# Patient Record
Sex: Female | Born: 1965 | Race: White | Hispanic: No | Marital: Married | Smoking: Never smoker
Health system: Southern US, Community
[De-identification: ages and names within clinical notes are randomized; demographics above are authoritative.]

## PROBLEM LIST (undated history)

## (undated) DIAGNOSIS — I509 Heart failure, unspecified: Secondary | ICD-10-CM

## (undated) DIAGNOSIS — E119 Type 2 diabetes mellitus without complications: Secondary | ICD-10-CM

## (undated) DIAGNOSIS — R7301 Impaired fasting glucose: Secondary | ICD-10-CM

## (undated) DIAGNOSIS — I219 Acute myocardial infarction, unspecified: Secondary | ICD-10-CM

## (undated) DIAGNOSIS — E669 Obesity, unspecified: Secondary | ICD-10-CM

## (undated) DIAGNOSIS — E785 Hyperlipidemia, unspecified: Secondary | ICD-10-CM

## (undated) DIAGNOSIS — I2510 Atherosclerotic heart disease of native coronary artery without angina pectoris: Secondary | ICD-10-CM

## (undated) DIAGNOSIS — K219 Gastro-esophageal reflux disease without esophagitis: Secondary | ICD-10-CM

## (undated) DIAGNOSIS — I10 Essential (primary) hypertension: Secondary | ICD-10-CM

## (undated) HISTORY — DX: Impaired fasting glucose: R73.01

## (undated) HISTORY — DX: Heart failure, unspecified: I50.9

## (undated) HISTORY — DX: Acute myocardial infarction, unspecified: I21.9

## (undated) HISTORY — DX: Atherosclerotic heart disease of native coronary artery without angina pectoris: I25.10

## (undated) HISTORY — DX: Obesity, unspecified: E66.9

## (undated) HISTORY — DX: Type 2 diabetes mellitus without complications: E11.9

## (undated) HISTORY — DX: Essential (primary) hypertension: I10

## (undated) HISTORY — DX: Gastro-esophageal reflux disease without esophagitis: K21.9

## (undated) HISTORY — DX: Hyperlipidemia, unspecified: E78.5

---

## 1997-12-21 ENCOUNTER — Other Ambulatory Visit: Admission: RE | Admit: 1997-12-21 | Discharge: 1997-12-21 | Payer: Self-pay | Admitting: Gynecology

## 1999-03-24 ENCOUNTER — Other Ambulatory Visit: Admission: RE | Admit: 1999-03-24 | Discharge: 1999-03-24 | Payer: Self-pay | Admitting: Gynecology

## 2000-06-11 ENCOUNTER — Other Ambulatory Visit: Admission: RE | Admit: 2000-06-11 | Discharge: 2000-06-11 | Payer: Self-pay | Admitting: Gynecology

## 2001-07-17 ENCOUNTER — Other Ambulatory Visit: Admission: RE | Admit: 2001-07-17 | Discharge: 2001-07-17 | Payer: Self-pay | Admitting: Gynecology

## 2002-08-12 ENCOUNTER — Other Ambulatory Visit: Admission: RE | Admit: 2002-08-12 | Discharge: 2002-08-12 | Payer: Self-pay | Admitting: Gynecology

## 2003-09-29 ENCOUNTER — Other Ambulatory Visit: Admission: RE | Admit: 2003-09-29 | Discharge: 2003-09-29 | Payer: Self-pay | Admitting: Gynecology

## 2004-08-12 ENCOUNTER — Emergency Department (HOSPITAL_COMMUNITY): Admission: EM | Admit: 2004-08-12 | Discharge: 2004-08-12 | Payer: Self-pay | Admitting: Emergency Medicine

## 2004-12-27 ENCOUNTER — Other Ambulatory Visit: Admission: RE | Admit: 2004-12-27 | Discharge: 2004-12-27 | Payer: Self-pay | Admitting: Gynecology

## 2006-01-04 ENCOUNTER — Other Ambulatory Visit: Admission: RE | Admit: 2006-01-04 | Discharge: 2006-01-04 | Payer: Self-pay | Admitting: Gynecology

## 2007-01-08 ENCOUNTER — Other Ambulatory Visit: Admission: RE | Admit: 2007-01-08 | Discharge: 2007-01-08 | Payer: Self-pay | Admitting: Gynecology

## 2007-01-30 ENCOUNTER — Encounter: Admission: RE | Admit: 2007-01-30 | Discharge: 2007-01-30 | Payer: Self-pay | Admitting: Gynecology

## 2008-01-21 ENCOUNTER — Other Ambulatory Visit: Admission: RE | Admit: 2008-01-21 | Discharge: 2008-01-21 | Payer: Self-pay | Admitting: Gynecology

## 2008-02-03 ENCOUNTER — Encounter: Admission: RE | Admit: 2008-02-03 | Discharge: 2008-02-03 | Payer: Self-pay | Admitting: Gynecology

## 2009-02-11 ENCOUNTER — Encounter: Admission: RE | Admit: 2009-02-11 | Discharge: 2009-02-11 | Payer: Self-pay | Admitting: Gynecology

## 2011-01-24 ENCOUNTER — Ambulatory Visit (HOSPITAL_COMMUNITY)
Admission: RE | Admit: 2011-01-24 | Discharge: 2011-01-24 | Disposition: A | Discharge status: Home / Self Care | Payer: BC Managed Care – PPO | Source: Ambulatory Visit | Attending: Family Medicine | Admitting: Family Medicine

## 2011-01-24 ENCOUNTER — Other Ambulatory Visit (HOSPITAL_COMMUNITY): Payer: Self-pay | Admitting: Family Medicine

## 2011-01-24 DIAGNOSIS — J069 Acute upper respiratory infection, unspecified: Secondary | ICD-10-CM

## 2011-01-24 DIAGNOSIS — R05 Cough: Secondary | ICD-10-CM | POA: Insufficient documentation

## 2011-01-24 DIAGNOSIS — R059 Cough, unspecified: Secondary | ICD-10-CM | POA: Insufficient documentation

## 2012-07-23 ENCOUNTER — Other Ambulatory Visit (HOSPITAL_COMMUNITY): Payer: Self-pay | Admitting: Cardiovascular Disease

## 2012-07-23 DIAGNOSIS — R011 Cardiac murmur, unspecified: Secondary | ICD-10-CM

## 2012-07-30 ENCOUNTER — Ambulatory Visit (HOSPITAL_COMMUNITY)
Admission: RE | Admit: 2012-07-30 | Discharge: 2012-07-30 | Disposition: A | Discharge status: Home / Self Care | Payer: BC Managed Care – PPO | Source: Ambulatory Visit | Attending: Cardiovascular Disease | Admitting: Cardiovascular Disease

## 2012-07-30 DIAGNOSIS — E785 Hyperlipidemia, unspecified: Secondary | ICD-10-CM | POA: Insufficient documentation

## 2012-07-30 DIAGNOSIS — I079 Rheumatic tricuspid valve disease, unspecified: Secondary | ICD-10-CM | POA: Insufficient documentation

## 2012-07-30 DIAGNOSIS — K219 Gastro-esophageal reflux disease without esophagitis: Secondary | ICD-10-CM | POA: Insufficient documentation

## 2012-07-30 DIAGNOSIS — R011 Cardiac murmur, unspecified: Secondary | ICD-10-CM | POA: Insufficient documentation

## 2012-07-30 DIAGNOSIS — Z8249 Family history of ischemic heart disease and other diseases of the circulatory system: Secondary | ICD-10-CM | POA: Insufficient documentation

## 2012-07-30 DIAGNOSIS — E669 Obesity, unspecified: Secondary | ICD-10-CM | POA: Insufficient documentation

## 2012-07-30 DIAGNOSIS — I517 Cardiomegaly: Secondary | ICD-10-CM | POA: Insufficient documentation

## 2012-07-30 NOTE — Progress Notes (Signed)
[facility]   2D echo completed 07/30/2012.   Cindy Elnore Cosens, RDCS  

## 2012-08-19 ENCOUNTER — Encounter (INDEPENDENT_AMBULATORY_CARE_PROVIDER_SITE_OTHER): Payer: BC Managed Care – PPO

## 2012-08-19 DIAGNOSIS — R0602 Shortness of breath: Secondary | ICD-10-CM

## 2012-08-22 ENCOUNTER — Other Ambulatory Visit: Payer: Self-pay | Admitting: Cardiovascular Disease

## 2012-08-22 LAB — LIPID PANEL
Cholesterol: 186 mg/dL (ref 0–200)
HDL: 35 mg/dL — ABNORMAL LOW (ref >39)
LDL Cholesterol: 119 mg/dL — ABNORMAL HIGH (ref 0–99)
Total CHOL/HDL Ratio: 5.3 Ratio
VLDL: 32 mg/dL (ref 0–40)

## 2012-08-28 ENCOUNTER — Encounter: Payer: Self-pay | Admitting: Cardiovascular Disease

## 2013-07-28 ENCOUNTER — Other Ambulatory Visit: Payer: Self-pay | Admitting: Gynecology

## 2013-07-28 DIAGNOSIS — R928 Other abnormal and inconclusive findings on diagnostic imaging of breast: Secondary | ICD-10-CM

## 2013-08-07 ENCOUNTER — Ambulatory Visit
Admission: RE | Admit: 2013-08-07 | Discharge: 2013-08-07 | Disposition: A | Discharge status: Home / Self Care | Payer: BC Managed Care – PPO | Source: Ambulatory Visit | Attending: Gynecology | Admitting: Gynecology

## 2013-08-07 DIAGNOSIS — R928 Other abnormal and inconclusive findings on diagnostic imaging of breast: Secondary | ICD-10-CM

## 2013-12-11 ENCOUNTER — Telehealth: Payer: Self-pay | Admitting: Neurology

## 2013-12-11 NOTE — Telephone Encounter (Signed)
I CALLED PATIENT TO SEE IF SHE WANTED TO COME IN ON Monday 12-15-13 PER DR PATEL HAD TO LEAVE HER A MESSAGE

## 2013-12-19 ENCOUNTER — Encounter: Payer: Self-pay | Admitting: *Deleted

## 2013-12-19 NOTE — Progress Notes (Unsigned)
Patient cancelled new patient appointment for 01-20-14. Dr. Sherwood GamblerFusco office notified

## 2013-12-22 ENCOUNTER — Other Ambulatory Visit: Payer: Self-pay | Admitting: Gynecology

## 2013-12-22 DIAGNOSIS — N6320 Unspecified lump in the left breast, unspecified quadrant: Secondary | ICD-10-CM

## 2014-01-20 ENCOUNTER — Ambulatory Visit: Payer: BC Managed Care – PPO | Admitting: Neurology

## 2014-01-27 ENCOUNTER — Encounter (INDEPENDENT_AMBULATORY_CARE_PROVIDER_SITE_OTHER): Payer: Self-pay

## 2014-01-27 ENCOUNTER — Ambulatory Visit
Admission: RE | Admit: 2014-01-27 | Discharge: 2014-01-27 | Disposition: A | Discharge status: Home / Self Care | Payer: BC Managed Care – PPO | Source: Ambulatory Visit | Attending: Gynecology | Admitting: Gynecology

## 2014-01-27 DIAGNOSIS — N6320 Unspecified lump in the left breast, unspecified quadrant: Secondary | ICD-10-CM

## 2014-07-06 ENCOUNTER — Other Ambulatory Visit: Payer: Self-pay

## 2014-07-06 DIAGNOSIS — Z1231 Encounter for screening mammogram for malignant neoplasm of breast: Secondary | ICD-10-CM

## 2014-07-07 ENCOUNTER — Encounter (INDEPENDENT_AMBULATORY_CARE_PROVIDER_SITE_OTHER): Payer: Self-pay | Admitting: *Deleted

## 2014-07-21 ENCOUNTER — Telehealth (INDEPENDENT_AMBULATORY_CARE_PROVIDER_SITE_OTHER): Payer: Self-pay | Admitting: *Deleted

## 2014-07-21 ENCOUNTER — Other Ambulatory Visit (INDEPENDENT_AMBULATORY_CARE_PROVIDER_SITE_OTHER): Payer: Self-pay | Admitting: *Deleted

## 2014-07-21 ENCOUNTER — Encounter (INDEPENDENT_AMBULATORY_CARE_PROVIDER_SITE_OTHER): Payer: Self-pay | Admitting: Internal Medicine

## 2014-07-21 ENCOUNTER — Ambulatory Visit (INDEPENDENT_AMBULATORY_CARE_PROVIDER_SITE_OTHER): Payer: BLUE CROSS/BLUE SHIELD | Admitting: Internal Medicine

## 2014-07-21 VITALS — BP 98/62 | HR 66 | Temp 97.9°F | Ht 67.0 in | Wt 225.8 lb

## 2014-07-21 DIAGNOSIS — K625 Hemorrhage of anus and rectum: Secondary | ICD-10-CM

## 2014-07-21 MED ORDER — PEG 3350-KCL-NA BICARB-NACL 420 G PO SOLR: 4000.0000 mL | Freq: Once | ORAL | Status: DC

## 2014-07-21 NOTE — Telephone Encounter (Signed)
Patient needs trilyte 

## 2014-07-21 NOTE — Progress Notes (Signed)
   Subjective:    Patient ID: Olivia Knight, female    DOB: Jul 10, 1965, 49 y.o.   MRN: [id number]  HPI Referred to our office by Dr. Phillips OdorGolding for rectal bleeding. She tell me she had rectal bleeding x 1 day. Occurred about 2 months ago. She saw Dr. Chevis PrettyMezer ([facility]) same day and was noted to have blood around her rectum.  Every time she went to the [facility] she would see blood. She has not seen any blood since. No abdominal pain. She was not constipated. She usually has a stool x 1 a day.  Appetite is good. No weight loss unintentional.  Stools are brown in color. No change in her stools.  No hx of anemia.  No family hx colon cancer. Review of Systems No past medical history on file.  No past surgical history on file.  Allergies  Allergen Reactions  . Fish Allergy   . Penicillins     No current outpatient prescriptions on file prior to visit.   No current facility-administered medications on file prior to visit.   Married, two step children. Works at the [employer]     Objective:   Physical Exam Blood pressure 98/62, pulse 66, temperature 97.9 F (36.6 C), height 5\' 7"  (1.702 m), weight 225 lb 12.8 oz (102.422 kg). Alert and oriented. Skin warm and dry. Oral mucosa is moist.   . Sclera anicteric, conjunctivae is pink. Thyroid not enlarged. No cervical lymphadenopathy. Lungs clear. Heart regular rate and rhythm.  Abdomen is soft. Bowel sounds are positive. No hepatomegaly. No abdominal masses felt. No tenderness.  No edema to lower extremities.         Assessment & Plan:  Rectal bleeding. Colonic neoplasm, AVM, polyp, hemorrhoid needs to be ruled out. No family hx of colon cancer. Colonoscopy. The risks and benefits such as perforation, bleeding, and infection were reviewed with the patient and is agreeable.

## 2014-07-21 NOTE — Patient Instructions (Signed)
Colonoscopy.  The risks and benefits such as perforation, bleeding, and infection were reviewed with the patient and is agreeable. 

## 2014-07-24 ENCOUNTER — Encounter (HOSPITAL_COMMUNITY)
Admission: RE | Disposition: A | Discharge status: Home / Self Care | Payer: Self-pay | Source: Ambulatory Visit | Attending: Internal Medicine

## 2014-07-24 ENCOUNTER — Ambulatory Visit (HOSPITAL_COMMUNITY)
Admission: RE | Admit: 2014-07-24 | Discharge: 2014-07-24 | Disposition: A | Discharge status: Home / Self Care | Payer: BLUE CROSS/BLUE SHIELD | Source: Ambulatory Visit | Attending: Internal Medicine | Admitting: Internal Medicine

## 2014-07-24 DIAGNOSIS — K644 Residual hemorrhoidal skin tags: Secondary | ICD-10-CM | POA: Insufficient documentation

## 2014-07-24 DIAGNOSIS — Z91013 Allergy to seafood: Secondary | ICD-10-CM | POA: Insufficient documentation

## 2014-07-24 DIAGNOSIS — Z881 Allergy status to other antibiotic agents status: Secondary | ICD-10-CM | POA: Diagnosis not present

## 2014-07-24 DIAGNOSIS — K6289 Other specified diseases of anus and rectum: Secondary | ICD-10-CM | POA: Diagnosis not present

## 2014-07-24 DIAGNOSIS — K648 Other hemorrhoids: Secondary | ICD-10-CM | POA: Diagnosis not present

## 2014-07-24 DIAGNOSIS — K625 Hemorrhage of anus and rectum: Secondary | ICD-10-CM

## 2014-07-24 DIAGNOSIS — Z880 Allergy status to penicillin: Secondary | ICD-10-CM | POA: Diagnosis not present

## 2014-07-24 HISTORY — PX: COLONOSCOPY: SHX5424

## 2014-07-24 SURGERY — COLONOSCOPY
Anesthesia: Moderate Sedation

## 2014-07-24 MED ORDER — MIDAZOLAM HCL 5 MG/5ML IJ SOLN
INTRAMUSCULAR | Status: DC | PRN
  Administered 2014-07-24 (×6): 2 mg via INTRAVENOUS

## 2014-07-24 MED ORDER — MIDAZOLAM HCL 5 MG/5ML IJ SOLN
INTRAMUSCULAR | Status: AC
  Filled 2014-07-24: qty 5

## 2014-07-24 MED ORDER — SODIUM CHLORIDE 0.9 % IV SOLN
INTRAVENOUS | Status: DC
  Administered 2014-07-24: 1000 mL via INTRAVENOUS

## 2014-07-24 MED ORDER — MEPERIDINE HCL 50 MG/ML IJ SOLN
INTRAMUSCULAR | Status: DC | PRN
  Administered 2014-07-24 (×2): 25 mg via INTRAVENOUS

## 2014-07-24 MED ORDER — MEPERIDINE HCL 50 MG/ML IJ SOLN
INTRAMUSCULAR | Status: AC
  Filled 2014-07-24: qty 1

## 2014-07-24 MED ORDER — STERILE WATER FOR IRRIGATION IR SOLN
Status: DC | PRN
  Administered 2014-07-24: 14:00:00

## 2014-07-24 MED ORDER — MIDAZOLAM HCL 5 MG/5ML IJ SOLN
INTRAMUSCULAR | Status: AC
  Filled 2014-07-24: qty 10

## 2014-07-24 NOTE — Discharge Instructions (Signed)
Resume usual diet and medications. No driving for 24 hours. Notify if you have further episodes of rectal bleeding.   Colonoscopy, Care After These instructions give you information on caring for yourself after your procedure. Your doctor may also give you more specific instructions. Call your doctor if you have any problems or questions after your procedure. HOME CARE  Do not drive for 24 hours.  Do not sign important papers or use machinery for 24 hours.  You may shower.  You may go back to your usual activities, but go slower for the first 24 hours.  Take rest breaks often during the first 24 hours.  Walk around or use warm packs on your belly (abdomen) if you have belly cramping or gas.  Drink enough fluids to keep your pee (urine) clear or pale yellow.  Resume your normal diet. Avoid heavy or fried foods.  Avoid drinking alcohol for 24 hours or as told by your doctor.  Only take medicines as told by your doctor. If a tissue sample (biopsy) was taken during the procedure:   Do not take aspirin or blood thinners for 7 days, or as told by your doctor.  Do not drink alcohol for 7 days, or as told by your doctor.  Eat soft foods for the first 24 hours. GET HELP IF: You still have a small amount of blood in your poop (stool) 2-3 days after the procedure. GET HELP RIGHT AWAY IF:  You have more than a small amount of blood in your poop.  You see clumps of tissue (blood clots) in your poop.  Your belly is puffy (swollen).  You feel sick to your stomach (nauseous) or throw up (vomit).  You have a fever.  You have belly pain that gets worse and medicine does not help. MAKE SURE YOU:  Understand these instructions.  Will watch your condition.  Will get help right away if you are not doing well or get worse. Document Released: 04/01/2010 Document Revised: 03/04/2013 Document Reviewed: 11/04/2012 [facility] Patient Information 2015 [employer] [place]. This information is  not intended to replace advice given to you by your health care provider. Make sure you discuss any questions you have with your health care provider.

## 2014-07-24 NOTE — Op Note (Signed)
COLONOSCOPY PROCEDURE REPORT  PATIENT:  Olivia Knight  MR#:  [id number] Birthdate:  01/31/1966, 49 y.o., female Endoscopist:  Dr. Malissa HippoNajeeb U. Nyheem Binette, MD Referred By:  Dr. Colette RibasJohn Cabot Golding, MD  Procedure Date: 07/24/2014  Procedure:   Colonoscopy  Indications:  Patient is 49 year old Caucasian female who had 4 episodes of rectal bleeding 1 day for a month ago. Family history is negative for CRC.  Informed Consent:  The procedure and risks were reviewed with the patient and informed consent was obtained.  Medications:  Demerol 50 mg IV Versed 12 mg IV  Description of procedure:  After a digital rectal exam was performed, that colonoscope was advanced from the anus through the rectum and colon to the area of the cecum, ileocecal valve and appendiceal orifice. The cecum was deeply intubated. These structures were well-seen and photographed for the record. From the level of the cecum and ileocecal valve, the scope was slowly and cautiously withdrawn. The mucosal surfaces were carefully surveyed utilizing scope tip to flexion to facilitate fold flattening as needed. The scope was pulled down into the rectum where a thorough exam including retroflexion was performed.  Findings:   Prep excellent. Normal mucosa of cecum, ascending colon, hepatic flexure, transverse colon, splenic flexure, descending and sigmoid colon. Normal rectal mucosa. Small hemorrhoids below the dentate line and single small anal papilla.   Therapeutic/Diagnostic Maneuvers Performed:  None  Complications:  None  Cecal Withdrawal Time:  10 minutes  Impression:  Examination performed to cecum. Small external hemorrhoids and single anal papilla otherwise normal colonoscopy.  Comment; She possibly from hemorrhoids or could've experienced self-limiting bout of colitis. No further workup unless bleeding recurs.  Recommendations:  Standard instructions given. Patient advised to call if she has further episodes of  rectal bleeding.  Annina Piotrowski U  07/24/2014 2:47 PM  CC: Dr. Phillips OdorGOLDING, Chancy HurterJOHN CABOT, MD & Dr. Bonnetta BarryNo ref. provider found

## 2014-07-24 NOTE — H&P (Signed)
Olivia Knight is an 49 y.o. female.   Chief Complaint: Patient is here for colonoscopy. HPI: Patient is 49 year old Caucasian female who had four episodes of rectal bleeding 1 day over a month ago. She did not experience abdominal pain diarrhea nausea vomiting. Each time she past few cc of fresh blood. She has not seen any more blood. Family history is negative for CRC.  No past medical history on file.  No past surgical history on file.  No family history on file. Social History:  reports that she has never smoked. She does not have any smokeless tobacco history on file. She reports that she does not drink alcohol or use illicit drugs.  Allergies:  Allergies  Allergen Reactions  . Ciprofloxacin   . Fish Allergy   . Penicillins   . Shellfish Allergy Itching    Medications Prior to Admission  Medication Sig Dispense Refill  . Fish Oil-Cholecalciferol (FISH OIL + D3) 1000-1000 MG-UNIT CAPS Take by mouth.    . norethindrone (MICRONOR,CAMILA,ERRIN) 0.35 MG tablet Take 1 tablet by mouth daily.    . polyethylene glycol-electrolytes (NULYTELY/GOLYTELY) 420 G solution Take 4,000 mLs by mouth once. 4000 mL 0    No results found for this or any previous visit (from the past 48 hour(s)). No results found.  ROS  Blood pressure 127/66, pulse 73, temperature 97.9 F (36.6 C), temperature source Oral, resp. rate 13, SpO2 100 %. Physical Exam  Constitutional: She appears well-developed and well-nourished.  HENT:  Mouth/Throat: Oropharynx is clear and moist.  Eyes: Conjunctivae are normal. No scleral icterus.  Neck: No thyromegaly present.  Cardiovascular: Normal rate, regular rhythm and normal heart sounds.   No murmur heard. Respiratory: Effort normal and breath sounds normal.  GI: Soft. She exhibits no distension and no mass. There is no tenderness.  Musculoskeletal: She exhibits no edema.  Lymphadenopathy:    She has no cervical adenopathy.  Neurological: She is alert.  Skin:  Skin is warm and dry.     Assessment/Plan Rectal bleeding. Diagnostic colonoscopy.  Kenric Ginger U 07/24/2014, 1:55 PM

## 2014-07-27 ENCOUNTER — Encounter (HOSPITAL_COMMUNITY): Payer: Self-pay | Admitting: Internal Medicine

## 2014-07-29 ENCOUNTER — Ambulatory Visit (INDEPENDENT_AMBULATORY_CARE_PROVIDER_SITE_OTHER): Payer: Self-pay | Admitting: Internal Medicine

## 2014-08-11 ENCOUNTER — Other Ambulatory Visit: Payer: Self-pay | Admitting: Gynecology

## 2014-08-11 ENCOUNTER — Other Ambulatory Visit: Payer: Self-pay

## 2014-08-11 DIAGNOSIS — N6320 Unspecified lump in the left breast, unspecified quadrant: Secondary | ICD-10-CM

## 2014-08-14 ENCOUNTER — Ambulatory Visit
Admission: RE | Admit: 2014-08-14 | Discharge: 2014-08-14 | Disposition: A | Discharge status: Home / Self Care | Payer: BLUE CROSS/BLUE SHIELD | Source: Ambulatory Visit | Attending: Gynecology | Admitting: Gynecology

## 2014-08-14 ENCOUNTER — Ambulatory Visit: Payer: Self-pay

## 2014-08-14 DIAGNOSIS — N6320 Unspecified lump in the left breast, unspecified quadrant: Secondary | ICD-10-CM

## 2014-11-04 ENCOUNTER — Encounter: Payer: Self-pay | Admitting: Cardiovascular Disease

## 2014-11-11 ENCOUNTER — Encounter: Payer: Self-pay | Admitting: Cardiovascular Disease

## 2015-05-07 IMAGING — MG MM DIAGNOSTIC UNILATERAL L
2 series · 2 of 2 positions shown · non-contrast
Comparison: Priors

CLINICAL DATA: Patient recalled from screening for possible left
breast mass.

EXAM:
DIGITAL DIAGNOSTIC  LEFT MAMMOGRAM
ULTRASOUND LEFT BREAST

[L CC]
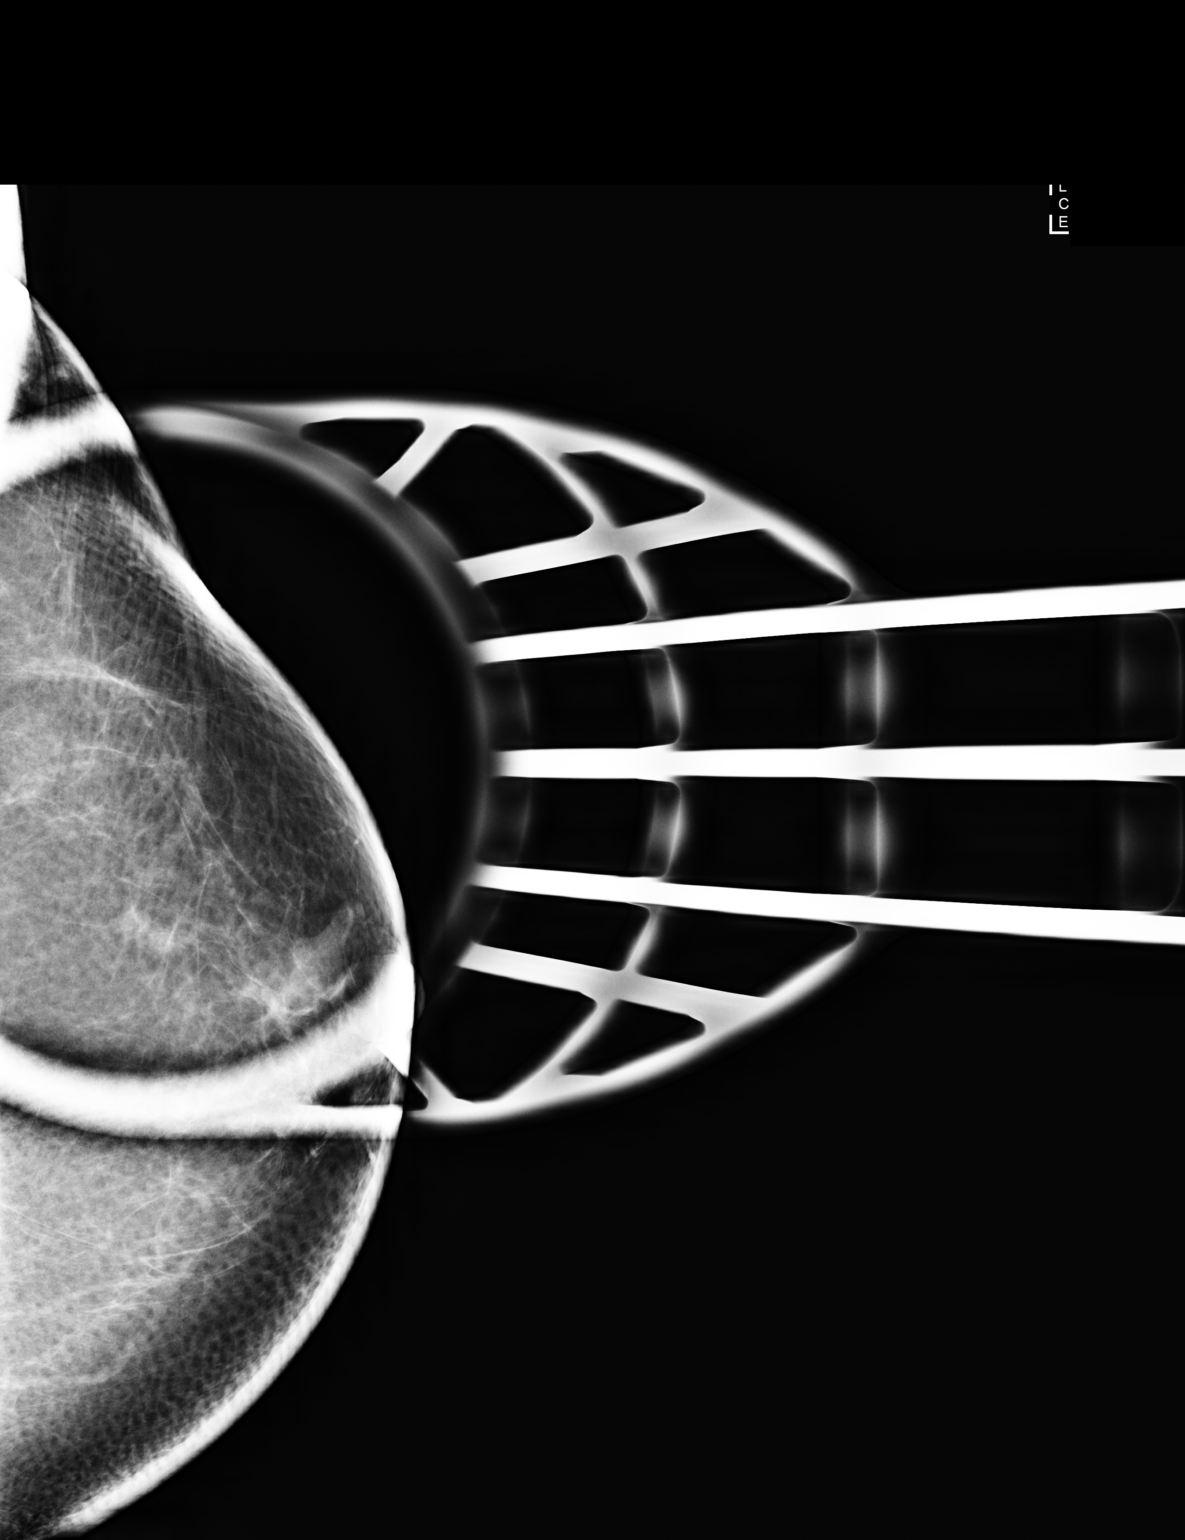

[L MLO]
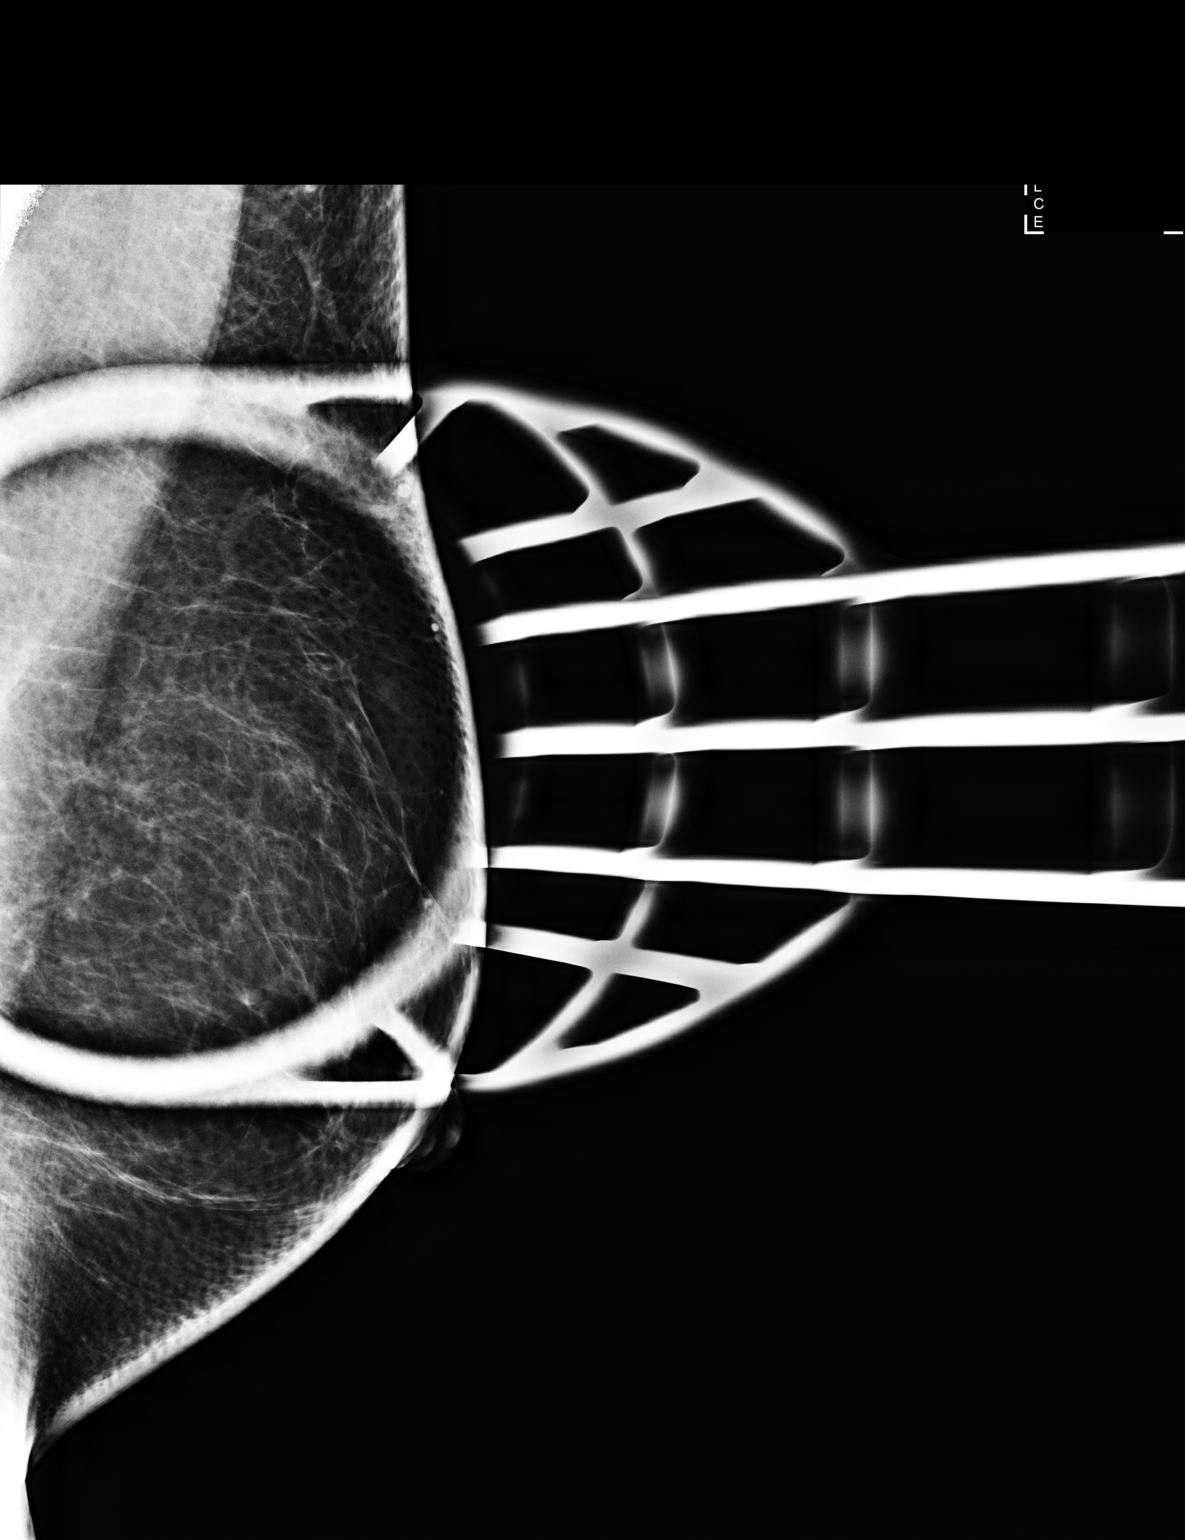

[2 of 2 positions shown; findings below may reference images not displayed]

ACR Breast Density Category b: There are scattered areas of
fibroglandular density.
FINDINGS: Spot compression CC and MLO views of the left breast were obtained.
These demonstrate a low density circumscribed 4 mm nodule within the
upper-outer left breast middle depth.

On physical exam, I palpate no discrete mass within the upper-outer
left breast.

Ultrasound is performed, showing no concerning mass within the
upper-outer left breast middle depth.
IMPRESSION: Probably benign low-density nodule upper outer left breast.

RECOMMENDATION:
Left breast diagnostic mammography in 6 months to demonstrate
stability.

I have discussed the findings and recommendations with the patient.
Results were also provided in writing at the conclusion of the
visit. If applicable, a reminder letter will be sent to the patient
regarding the next appointment.

BI-RADS CATEGORY  3: Probably benign.

## 2015-08-06 ENCOUNTER — Other Ambulatory Visit: Payer: Self-pay

## 2015-08-06 DIAGNOSIS — Z1231 Encounter for screening mammogram for malignant neoplasm of breast: Secondary | ICD-10-CM

## 2015-08-27 ENCOUNTER — Ambulatory Visit
Admission: RE | Admit: 2015-08-27 | Discharge: 2015-08-27 | Disposition: A | Discharge status: Home / Self Care | Payer: BLUE CROSS/BLUE SHIELD | Source: Ambulatory Visit

## 2015-08-27 DIAGNOSIS — Z1231 Encounter for screening mammogram for malignant neoplasm of breast: Secondary | ICD-10-CM

## 2017-05-26 IMAGING — MG DIGITAL SCREENING BILATERAL MAMMOGRAM WITH CAD
4 series · 4 of 4 positions shown · non-contrast
Comparison: Previous exam(s).

CLINICAL DATA: Screening.

EXAM:
DIGITAL SCREENING BILATERAL MAMMOGRAM WITH CAD

[R MLO]
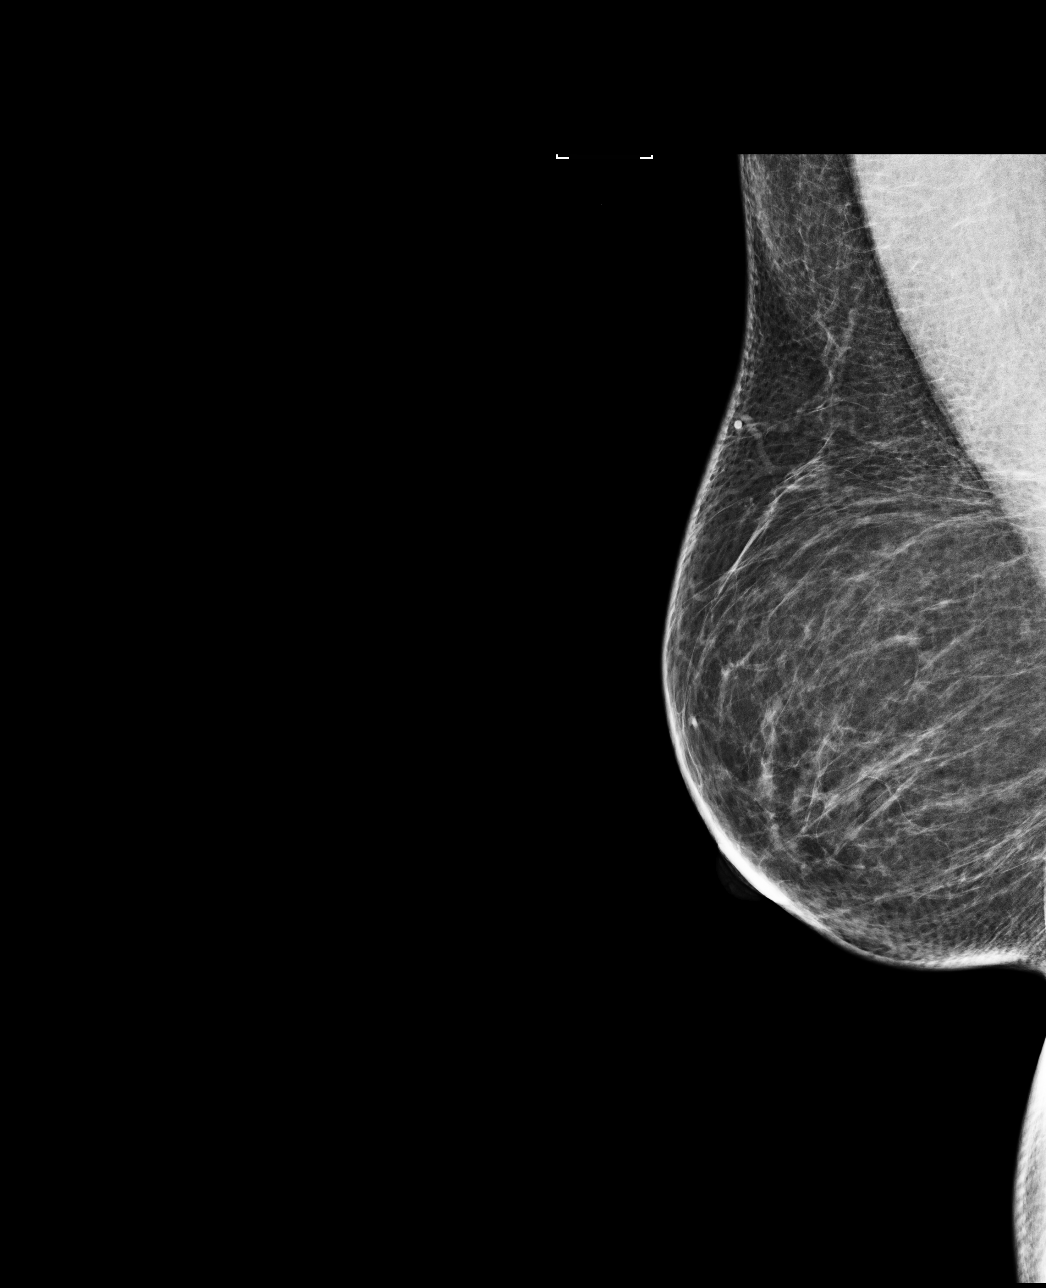

[R CC]
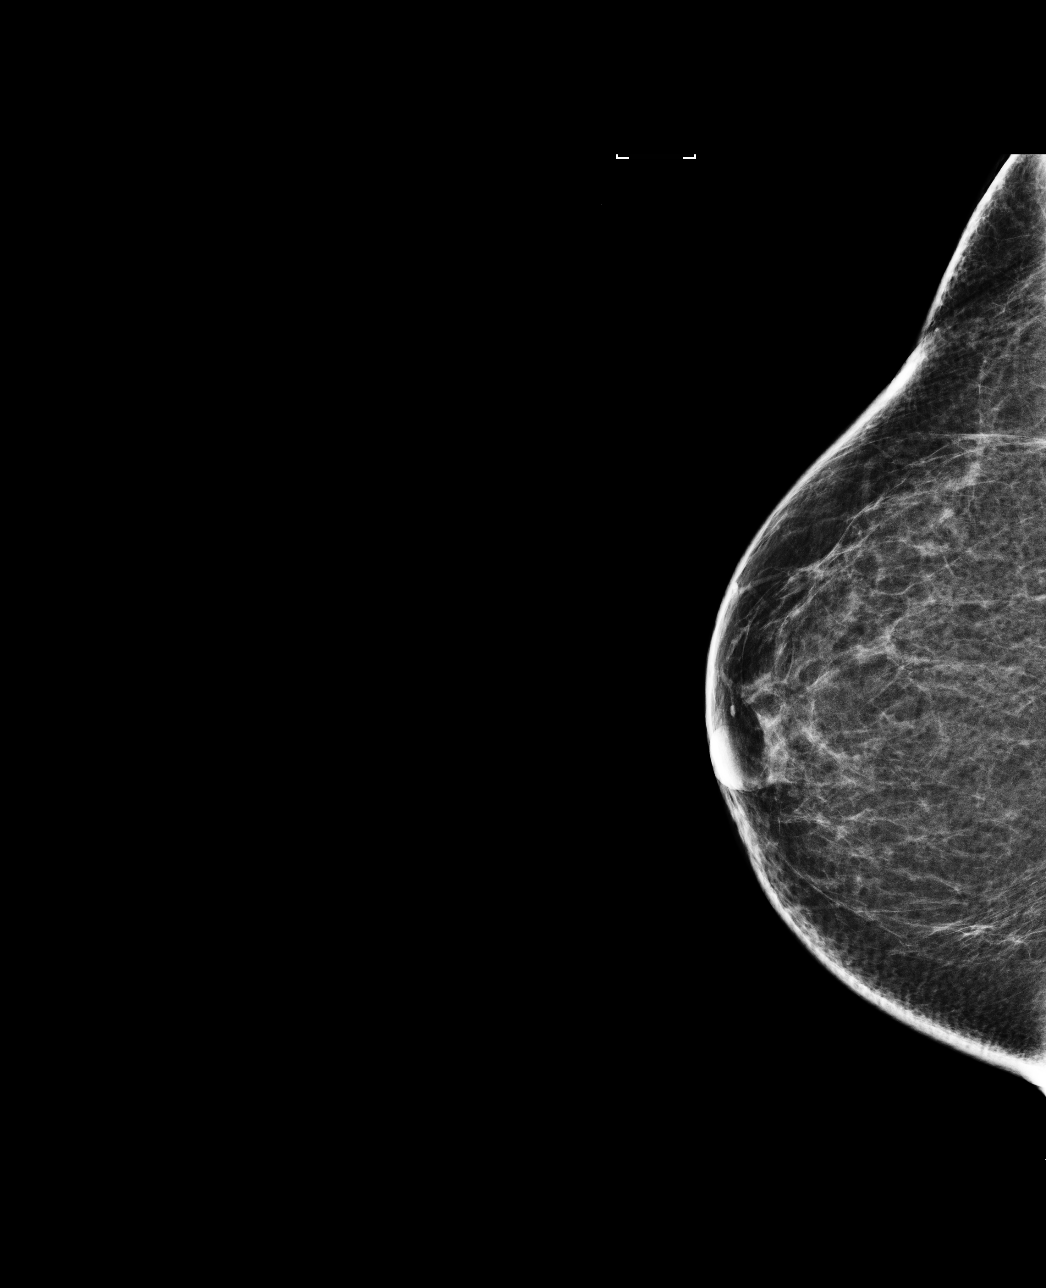

[L CC]
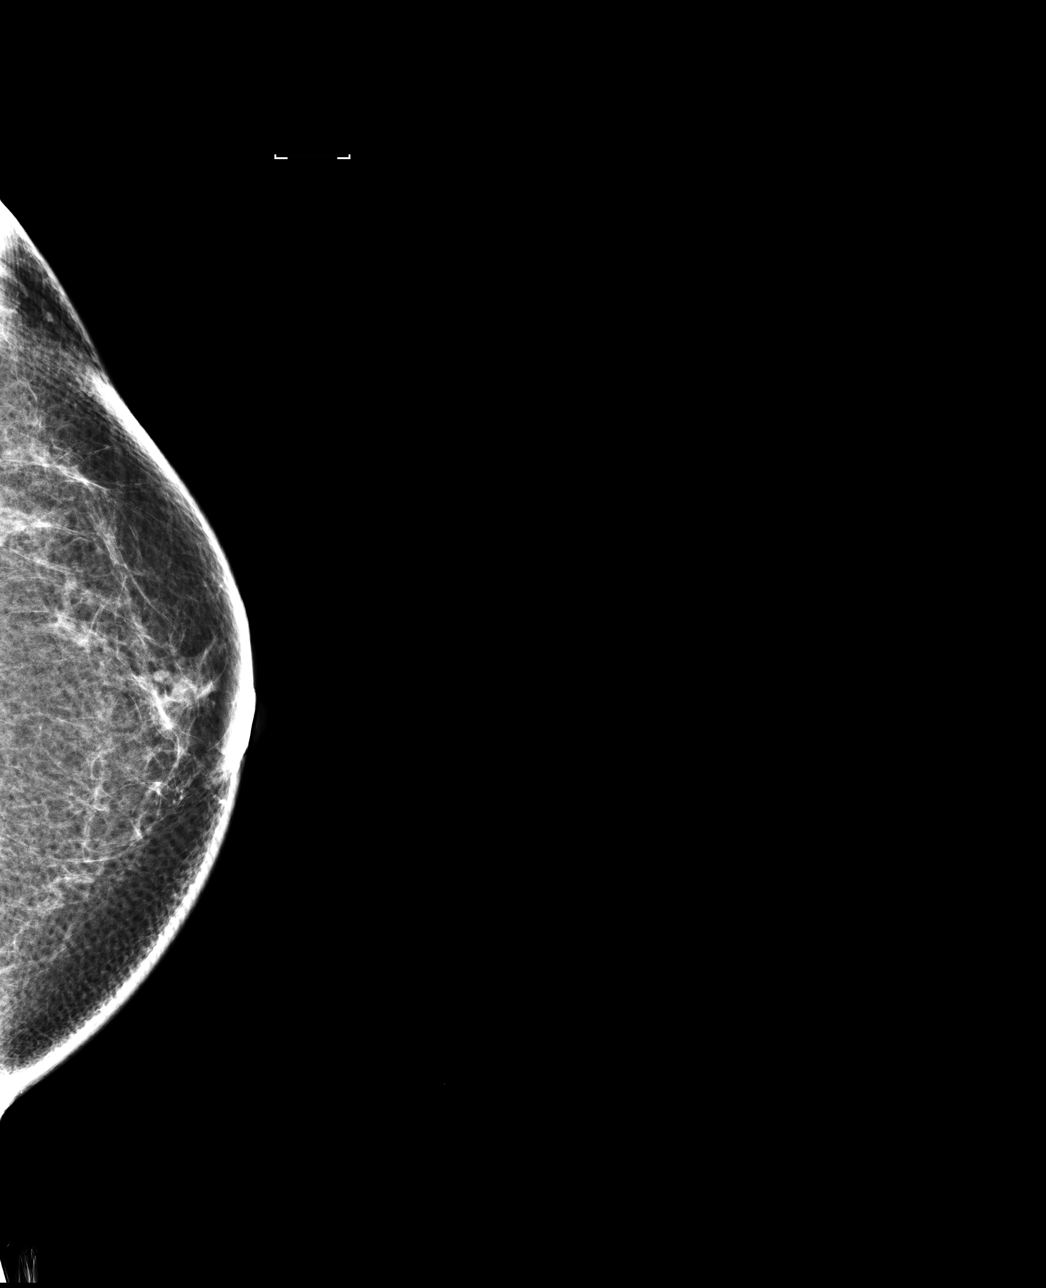

[L MLO]
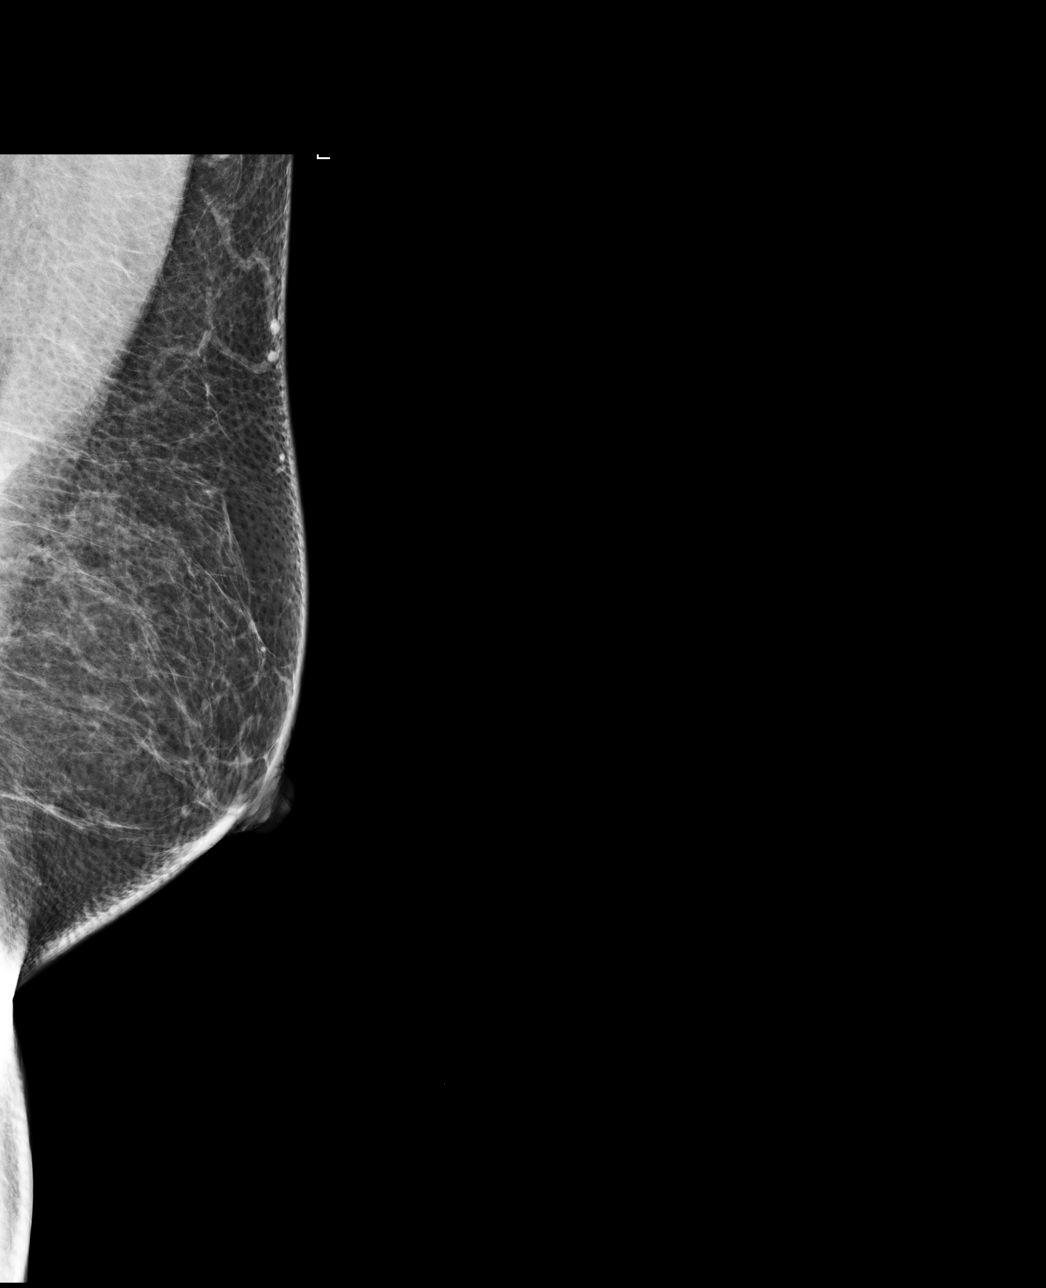

[4 of 4 positions shown; findings below may reference images not displayed]

ACR Breast Density Category b: There are scattered areas of
fibroglandular density.
FINDINGS: There are no findings suspicious for malignancy. Images were
processed with CAD.
IMPRESSION: No mammographic evidence of malignancy. A result letter of this
screening mammogram will be mailed directly to the patient.

RECOMMENDATION:
Screening mammogram in one year. (Code:AS-G-LCT)

BI-RADS CATEGORY  1: Negative.

## 2021-10-12 DIAGNOSIS — M7502 Adhesive capsulitis of left shoulder: Secondary | ICD-10-CM | POA: Diagnosis not present

## 2021-10-13 ENCOUNTER — Ambulatory Visit (INDEPENDENT_AMBULATORY_CARE_PROVIDER_SITE_OTHER): Payer: Commercial Managed Care - PPO | Admitting: Orthopedic Surgery

## 2021-10-13 ENCOUNTER — Ambulatory Visit (INDEPENDENT_AMBULATORY_CARE_PROVIDER_SITE_OTHER): Payer: Commercial Managed Care - PPO

## 2021-10-13 ENCOUNTER — Encounter: Payer: Self-pay | Admitting: Orthopedic Surgery

## 2021-10-13 ENCOUNTER — Ambulatory Visit: Payer: Self-pay

## 2021-10-13 DIAGNOSIS — M7502 Adhesive capsulitis of left shoulder: Secondary | ICD-10-CM | POA: Diagnosis not present

## 2021-10-13 DIAGNOSIS — M25512 Pain in left shoulder: Secondary | ICD-10-CM

## 2021-10-15 ENCOUNTER — Encounter: Payer: Self-pay | Admitting: Orthopedic Surgery

## 2021-10-15 MED ORDER — METHYLPREDNISOLONE ACETATE 40 MG/ML IJ SUSP
40.0000 mg | INTRAMUSCULAR | Status: AC | PRN
  Administered 2021-10-13: 40 mg via INTRA_ARTICULAR

## 2021-10-15 MED ORDER — LIDOCAINE HCL 1 % IJ SOLN
5.0000 mL | INTRAMUSCULAR | Status: AC | PRN
  Administered 2021-10-13: 5 mL

## 2021-10-15 MED ORDER — BUPIVACAINE HCL 0.5 % IJ SOLN
9.0000 mL | INTRAMUSCULAR | Status: AC | PRN
  Administered 2021-10-13: 9 mL via INTRA_ARTICULAR

## 2021-10-15 NOTE — Progress Notes (Addendum)
Office Visit Note   Patient: Olivia Knight           Date of Birth: April 28, 1965           MRN: [id number] Visit Date: 10/13/2021 Requested by: Assunta Found, MD [address] [place],  [place] [address] PCP: Assunta Found, MD  Subjective: Chief Complaint  Patient presents with   Left Shoulder - Pain    HPI: Olivia Knight is a 56 year old patient with 49-month history of left shoulder pain.  Denies any history of injury.  Reports decreased range of motion and pain.  Denies any numbness and tingling or any neck pain.  Has tried ibuprofen without much relief.              ROS: All systems reviewed are negative as they relate to the chief complaint within the history of present illness.  Patient denies  fevers or chills.   Assessment & Plan: Visit Diagnoses:  1. Left shoulder pain, unspecified chronicity   2. Adhesive capsulitis of left shoulder     Plan: Impression is left shoulder adhesive capsulitis.  Plan is home exercise program plus glenohumeral joint injection today.  I can see her back when she is coming in to follow-up with her husband.  Assessment can be made at that time in approximately 6 weeks for repeat injection and formal physical therapy versus observation versus surgical manipulation under anesthesia with rotator interval release.  Follow-Up Instructions: Return if symptoms worsen or fail to improve.   Orders:  Orders Placed This Encounter  Procedures   XR Shoulder Left   US Guided Needle Placement - No Linked Charges   No orders of the defined types were placed in this encounter.     Procedures: Large Joint Inj: L glenohumeral on 10/13/2021 9:40 AM Indications: diagnostic evaluation and pain Details: 18 G 1.5 in needle, ultrasound-guided posterior approach  Arthrogram: No  Medications: 9 mL bupivacaine 0.5 %; 40 mg methylPREDNISolone acetate 40 MG/ML; 5 mL lidocaine 1 % Outcome: tolerated well, no immediate complications Procedure, treatment alternatives,  risks and benefits explained, specific risks discussed. Consent was given by the patient. Immediately prior to procedure a time out was called to verify the correct patient, procedure, equipment, support staff and site/side marked as required. Patient was prepped and draped in the usual sterile fashion.       Clinical Data: No additional findings.  Objective: Vital Signs: There were no vitals taken for this visit.  Physical Exam:   Constitutional: Patient appears well-developed HEENT:  Head: Normocephalic Eyes:EOM are normal Neck: Normal range of motion Cardiovascular: Normal rate Pulmonary/chest: Effort normal Neurologic: Patient is alert Skin: Skin is warm Psychiatric: Patient has normal mood and affect   Ortho Exam: Ortho exam demonstrates full active and passive range of motion of the cervical spine.  Left shoulder demonstrates passive range of motion of approximately 30/80/120.  This is diminished compared to the right-hand side.  Rotator cuff strength intact.  Motor or sensory function to the hand intact  Specialty Comments:  No specialty comments available.  Imaging: No results found.   PMFS History: Patient Active Problem List   Diagnosis Date Noted   Rectal bleeding 07/21/2014   Past Medical History:  Diagnosis Date   CAD (coronary artery disease)    CHF (congestive heart failure) (HCC)    Diabetes (HCC)    GERD (gastroesophageal reflux disease)    Hyperlipidemia    Hypertension    Impaired fasting glucose    MI (myocardial  infarction) (HCC)    Obesity     History reviewed. No pertinent family history.  Past Surgical History:  Procedure Laterality Date   COLONOSCOPY N/A 07/24/2014   Procedure: COLONOSCOPY;  Surgeon: Malissa Hippo, MD;  Location[facility];  Service: Endoscopy;  Laterality: N/A;  120   Social History   Occupational History   Not on file  Tobacco Use   Smoking status: Never   Smokeless tobacco: Not on file  Substance and  Sexual Activity   Alcohol use: No    Alcohol/week: 0.0 standard drinks of alcohol   Drug use: No   Sexual activity: Not on file

## 2021-10-24 ENCOUNTER — Telehealth: Payer: Self-pay | Admitting: Surgical

## 2021-10-24 NOTE — Telephone Encounter (Signed)
Patient returns following glenohumeral injection into the left shoulder for adhesive capsulitis.  She is accompanying her husband to his appointment today following his shoulder surgery.  She reports some relief from injection but not 100% relief.  She has been doing home exercise program over the last week for about 15 minutes/day but states that she could be doing more exercising.  On exam, she has 20 degrees external rotation, 50 degrees abduction, 120 degrees forward flexion of the left shoulder compared with the right shoulder which has 60 degrees external rotation, 85 degrees abduction, 160 degrees forward flexion.  Plan for her to return in 4 weeks for clinical recheck with Oswaldo Done, her husband so that they will be seen together and consider repeat glenohumeral injection at that time.

## 2023-01-10 ENCOUNTER — Ambulatory Visit (INDEPENDENT_AMBULATORY_CARE_PROVIDER_SITE_OTHER): Payer: Commercial Managed Care - PPO | Admitting: Orthopedic Surgery

## 2023-01-10 ENCOUNTER — Other Ambulatory Visit (INDEPENDENT_AMBULATORY_CARE_PROVIDER_SITE_OTHER): Payer: Commercial Managed Care - PPO

## 2023-01-10 ENCOUNTER — Encounter: Payer: Self-pay | Admitting: Orthopedic Surgery

## 2023-01-10 DIAGNOSIS — M25562 Pain in left knee: Secondary | ICD-10-CM

## 2023-01-10 DIAGNOSIS — G8929 Other chronic pain: Secondary | ICD-10-CM

## 2023-01-10 DIAGNOSIS — M1712 Unilateral primary osteoarthritis, left knee: Secondary | ICD-10-CM | POA: Diagnosis not present

## 2023-01-10 DIAGNOSIS — M25551 Pain in right hip: Secondary | ICD-10-CM | POA: Diagnosis not present

## 2023-01-10 MED ORDER — METHYLPREDNISOLONE 4 MG PO TBPK: ORAL_TABLET | ORAL | 0 refills | Status: DC

## 2023-01-14 ENCOUNTER — Encounter: Payer: Self-pay | Admitting: Orthopedic Surgery

## 2023-01-14 MED ORDER — METHYLPREDNISOLONE ACETATE 40 MG/ML IJ SUSP
40.0000 mg | INTRAMUSCULAR | Status: AC | PRN
  Administered 2023-01-10: 40 mg via INTRA_ARTICULAR

## 2023-01-14 MED ORDER — BUPIVACAINE HCL 0.25 % IJ SOLN
4.0000 mL | INTRAMUSCULAR | Status: AC | PRN
  Administered 2023-01-10: 4 mL via INTRA_ARTICULAR

## 2023-01-14 MED ORDER — LIDOCAINE HCL 1 % IJ SOLN
5.0000 mL | INTRAMUSCULAR | Status: AC | PRN
  Administered 2023-01-10: 5 mL

## 2023-01-14 NOTE — Progress Notes (Signed)
Office Visit Note   Patient: Olivia Knight           Date of Birth: May 02, 1965           MRN: [id number] Visit Date: 01/10/2023 Requested by: Assunta Found, MD [address] [place],  [place] [address] PCP: Assunta Found, MD  Subjective: Chief Complaint  Patient presents with   Left Knee - Pain    HPI: Olivia Knight is a 57 y.o. female who presents to the office reporting left knee pain since July.  The patient fell over the dog and she sustained a hyperflexion injury to the knee at that time.  Having some difficulty getting up.  Describes medial pain.  No grinding catching or giving way.  She is okay to walk.  She also reports some superior gluteal pain.  18 years ago she had a cortisone shot in the knee and has had no problems since..                ROS: All systems reviewed are negative as they relate to the chief complaint within the history of present illness.  Patient denies fevers or chills.  Assessment & Plan: Visit Diagnoses:  1. Chronic pain of left knee   2. 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  She also has left knee pain with hyperflexion injury and possible meniscal problem.  Radiographs are fairly unrevealing.  Plan at this time is Medrol Dosepak 6-day course with left knee injection.  We can consider further intervention depending on how she responds to those interventions in terms of injection for the knee and Dosepak for the back.  Follow-Up Instructions: No follow-ups on file.   Orders:  Orders Placed This Encounter  Procedures   XR Lumbar Spine 2-3 Views   XR KNEE 3 VIEW LEFT   Meds ordered this encounter  Medications   methylPREDNISolone (MEDROL DOSEPAK) 4 MG TBPK tablet    Sig: Take as directed on packaging.    Dispense:  21 tablet    Refill:  0      Procedures: Large Joint Inj: L knee on 01/10/2023 11:56 AM Indications: diagnostic evaluation, joint swelling and  pain Details: 18 G 1.5 in needle, superolateral approach  Arthrogram: No  Medications: 5 mL lidocaine 1 %; 40 mg methylPREDNISolone acetate 40 MG/ML; 4 mL bupivacaine 0.25 % Outcome: tolerated well, no immediate complications Procedure, treatment alternatives, risks and benefits explained, specific risks discussed. Consent was given by the patient. Immediately prior to procedure a time out was called to verify the correct patient, procedure, equipment, support staff and site/side marked as required. Patient was prepped and draped in the usual sterile fashion.       Clinical Data: No additional findings.  Objective: Vital Signs: There were no vitals taken for this visit.  Physical Exam:  Constitutional: Patient appears well-developed HEENT:  Head: Normocephalic Eyes:EOM are normal Neck: Normal range of motion Cardiovascular: Normal rate Pulmonary/chest: Effort normal Neurologic: Patient is alert Skin: Skin is warm Psychiatric: Patient has normal mood and affect  Ortho Exam: Ortho exam demonstrates palpable pedal pulses.  No nerve root tension signs.  Left knee has no effusion but medial joint line tenderness.  Collateral cruciate ligaments are stable.  McMurray compression testing equivocal on the left negative on the right.  Range of motion is full in that left knee.  No groin pain with internal/external rotation of either  leg.  No other masses lymphadenopathy or skin changes noted in that knee region.  Specialty Comments:  No specialty comments available.  Imaging: No results found.   PMFS History: Patient Active Problem List   Diagnosis Date Noted   Rectal bleeding 07/21/2014   Past Medical History:  Diagnosis Date   CAD (coronary artery disease)    CHF (congestive heart failure) (HCC)    Diabetes (HCC)    GERD (gastroesophageal reflux disease)    Hyperlipidemia    Hypertension    Impaired fasting glucose    MI (myocardial infarction) (HCC)    Obesity      History reviewed. No pertinent family history.  Past Surgical History:  Procedure Laterality Date   COLONOSCOPY N/A 07/24/2014   Procedure: COLONOSCOPY;  Surgeon: Malissa Hippo, MD;  Location: [facility];  Service: Endoscopy;  Laterality: N/A;  120   Social History   Occupational History   Not on file  Tobacco Use   Smoking status: Never   Smokeless tobacco: Not on file  Substance and Sexual Activity   Alcohol use: No    Alcohol/week: 0.0 standard drinks of alcohol   Drug use: No   Sexual activity: Not on file

## 2023-04-23 ENCOUNTER — Ambulatory Visit (INDEPENDENT_AMBULATORY_CARE_PROVIDER_SITE_OTHER): Payer: Commercial Managed Care - PPO | Admitting: Orthopedic Surgery

## 2023-04-23 ENCOUNTER — Telehealth: Payer: Self-pay

## 2023-04-23 ENCOUNTER — Encounter: Payer: Self-pay | Admitting: Orthopedic Surgery

## 2023-04-23 ENCOUNTER — Other Ambulatory Visit: Payer: Self-pay | Admitting: Surgical

## 2023-04-23 DIAGNOSIS — M5416 Radiculopathy, lumbar region: Secondary | ICD-10-CM

## 2023-04-23 MED ORDER — DIAZEPAM 5 MG PO TABS: 5.0000 mg | ORAL_TABLET | Freq: Once | ORAL | 0 refills | Status: AC

## 2023-04-23 NOTE — Telephone Encounter (Signed)
 Sent in

## 2023-04-23 NOTE — Progress Notes (Signed)
Office Visit Note   Patient: Olivia Knight           Date of Birth: December 07, 1965           MRN: [id number] Visit Date: 04/23/2023 Requested by: Assunta Found, MD [address] [place],  [place] [address] PCP: Assunta Found, MD  Subjective: Chief Complaint  Patient presents with   Left Knee - Pain, Follow-up   Right Hip - Follow-up, Pain    HPI: Olivia Knight is a 58 y.o. female who presents to the office reporting left knee and right hip pain.  Patient had left knee injection on 01/10/2023 and she did get very good relief from that for about 3 months.  She states "I can deal with my knee".  She also reports some right buttock and radiating pain down into the lateral compartment of the right leg.  Hurts more with sitting.  Takes ibuprofen on a daily basis for her symptoms.  Will occasionally wake her from sleep.  Has been going on for well over 6 weeks.  She has to adjust herself when sitting because of the pain.  Describes relatively constant pain in the back and buttock region.  She has tried a prior Medrol Dosepak without any relief..                ROS: All systems reviewed are negative as they relate to the chief complaint within the history of present illness.  Patient denies fevers or chills.  Assessment & Plan: Visit Diagnoses:  1. Radiculopathy, lumbar region     Plan: Impression is left knee which is doing well following injection.  We talked about another injection today but she wants to hold off on that.  She has about a 5 degree flexion contracture and trace effusion but otherwise is managing reasonably well to her left knee and no intervention required at this time.  Regarding her back she is having clear right-sided radiculopathy at L4-5.  She is failed conservative management including a home exercise stretching program as well as activity modification and a Medrol Dosepak.  Symptoms ongoing for well over 6 weeks.  Radiographs of the lumbar spine show mild  degenerative changes.  Plan at this time is MRI open of the lumbar spine to evaluate for epidural steroid injections.  This does not look like a surgical problem.  Her husband has had back surgery and she wants to avoid that if possible.  She can follow-up or I can call her with those results and we will get her set up to see Dr. Alvester Morin in the near future  Follow-Up Instructions: No follow-ups on file.   Orders:  Orders Placed This Encounter  Procedures   MR Lumbar Spine w/o contrast   No orders of the defined types were placed in this encounter.     Procedures: No procedures performed   Clinical Data: No additional findings.  Objective: Vital Signs: There were no vitals taken for this visit.  Physical Exam:  Constitutional: Patient appears well-developed HEENT:  Head: Normocephalic Eyes:EOM are normal Neck: Normal range of motion Cardiovascular: Normal rate Pulmonary/chest: Effort normal Neurologic: Patient is alert Skin: Skin is warm Psychiatric: Patient has normal mood and affect  Ortho Exam: Ortho exam demonstrates no nerve root tension signs.  5 out of 5 ankle dorsiflexion plantarflexion quad hamstring strength.  No definite paresthesias L1 S1 bilaterally.  No groin pain with internal/external Tatian of the leg.  Patient does have some pain forward lateral bending  but no trochanteric tenderness.  Left knee has trace effusion with 5 degree flexion contracture but full flexion.  Collateral cruciate ligaments are stable.  Pedal pulses palpable.  Specialty Comments:  No specialty comments available.  Imaging: No results found.   PMFS History: Patient Active Problem List   Diagnosis Date Noted   Rectal bleeding 07/21/2014   Past Medical History:  Diagnosis Date   CAD (coronary artery disease)    CHF (congestive heart failure) (HCC)    Diabetes (HCC)    GERD (gastroesophageal reflux disease)    Hyperlipidemia    Hypertension    Impaired fasting glucose    MI  (myocardial infarction) (HCC)    Obesity     History reviewed. No pertinent family history.  Past Surgical History:  Procedure Laterality Date   COLONOSCOPY N/A 07/24/2014   Procedure: COLONOSCOPY;  Surgeon: Malissa Hippo, MD;  Location: [facility];  Service: Endoscopy;  Laterality: N/A;  120   Social History   Occupational History   Not on file  Tobacco Use   Smoking status: Never   Smokeless tobacco: Not on file  Substance and Sexual Activity   Alcohol use: No    Alcohol/week: 0.0 standard drinks of alcohol   Drug use: No   Sexual activity: Not on file

## 2023-04-23 NOTE — Telephone Encounter (Signed)
 Patient needing valium  pre MRI procedure

## 2023-04-26 ENCOUNTER — Encounter: Payer: Self-pay | Admitting: Orthopedic Surgery

## 2023-04-30 ENCOUNTER — Ambulatory Visit
Admission: RE | Admit: 2023-04-30 | Discharge: 2023-04-30 | Disposition: A | Discharge status: Home / Self Care | Payer: Commercial Managed Care - PPO | Source: Ambulatory Visit | Attending: Orthopedic Surgery | Admitting: Orthopedic Surgery

## 2023-04-30 DIAGNOSIS — M5416 Radiculopathy, lumbar region: Secondary | ICD-10-CM

## 2023-05-09 ENCOUNTER — Ambulatory Visit (INDEPENDENT_AMBULATORY_CARE_PROVIDER_SITE_OTHER): Payer: Commercial Managed Care - PPO | Admitting: Orthopedic Surgery

## 2023-05-09 ENCOUNTER — Telehealth: Payer: Self-pay

## 2023-05-09 DIAGNOSIS — M5416 Radiculopathy, lumbar region: Secondary | ICD-10-CM

## 2023-05-09 DIAGNOSIS — M1712 Unilateral primary osteoarthritis, left knee: Secondary | ICD-10-CM

## 2023-05-09 NOTE — Telephone Encounter (Signed)
Auth needed for left knee gel 

## 2023-05-10 ENCOUNTER — Encounter: Payer: Self-pay | Admitting: Orthopedic Surgery

## 2023-05-10 NOTE — Progress Notes (Signed)
 Office Visit Note   Patient: Olivia Knight           Date of Birth: 01-24-1966           MRN: [id number] Visit Date: 05/09/2023 Requested by: Assunta Found, MD [address] [place],  [place] [address] PCP: Assunta Found, MD  Subjective: Chief Complaint  Patient presents with   Lower Back - Pain, Follow-up    HPI: Olivia Knight is a 58 y.o. female who presents to the office reporting low back pain radiating down the right leg.  Takes ibuprofen twice a day.  MRI scan is reviewed.  Shows moderate right-sided L5 1 and L3-4 foraminal stenosis.  Patient denies any weakness.  Does not take any anticoagulants.  She also is describing continued left knee pain.  Has had an injection in the left knee last year which gave her good relief.  She is not really interested in knee replacement but does have some arthritis in the left knee..                ROS: All systems reviewed are negative as they relate to the chief complaint within the history of present illness.  Patient denies fevers or chills.  Assessment & Plan: Visit Diagnoses:  1. Radiculopathy, lumbar region     Plan: Impression is right lower extremity radiculopathy with left knee arthritis.  MRI scan shows pathology consistent with her symptoms.  Does not look operative at this time.  Recommend epidural steroid injection with Dr. Alvester Morin and we will preapproved for gel injection on the left and also inject that left knee today. This patient is diagnosed with osteoarthritis of the knee(s).    Radiographs show evidence of joint space narrowing, osteophytes, subchondral sclerosis and/or subchondral cysts.  This patient has knee pain which interferes with functional and activities of daily living.    This patient has experienced inadequate response, adverse effects and/or intolerance with conservative treatments such as acetaminophen, NSAIDS, topical creams, physical therapy or regular exercise, knee bracing and/or weight loss.    This patient has experienced inadequate response or has a contraindication to intra articular steroid injections for at least 3 months.   This patient is not scheduled to have a total knee replacement within 6 months of starting treatment with viscosupplementation.   Follow-Up Instructions: No follow-ups on file.   Orders:  Orders Placed This Encounter  Procedures   Ambulatory referral to Physical Medicine Rehab   No orders of the defined types were placed in this encounter.     Procedures: Large Joint Inj: L knee on 05/09/2023 1:12 PM Indications: diagnostic evaluation, joint swelling and pain Details: 18 G 1.5 in needle, superolateral approach  Arthrogram: No  Medications: 5 mL lidocaine 1 %; 40 mg methylPREDNISolone acetate 40 MG/ML; 4 mL bupivacaine 0.25 % Outcome: tolerated well, no immediate complications Procedure, treatment alternatives, risks and benefits explained, specific risks discussed. Consent was given by the patient. Immediately prior to procedure a time out was called to verify the correct patient, procedure, equipment, support staff and site/side marked as required. Patient was prepped and draped in the usual sterile fashion.       Clinical Data: No additional findings.  Objective: Vital Signs: There were no vitals taken for this visit.  Physical Exam:  Constitutional: Patient appears well-developed HEENT:  Head: Normocephalic Eyes:EOM are normal Neck: Normal range of motion Cardiovascular: Normal rate Pulmonary/chest: Effort normal Neurologic: Patient is alert Skin: Skin is warm Psychiatric: Patient has normal  mood and affect  Ortho Exam: Ortho exam demonstrates range of motion of the left knee of 10-1 10.  Mild nerve root tension signs on the right compared to the left.  No groin pain with internal/external Tatian of either leg.  No effusion in either knee.  Pedal pulses palpable.  Patient has 5 out of 5 ankle dorsiflexion plantarflexion quad  and hamstring strength with no definite paresthesias L 1 to S1 bilaterally.  Specialty Comments:  Narrative & Impression CLINICAL DATA:  Right-sided radicular pain   EXAM: MRI LUMBAR SPINE WITHOUT CONTRAST   TECHNIQUE: Multiplanar, multisequence MR imaging of the lumbar spine was performed. No intravenous contrast was administered.   COMPARISON:  X-ray 01/10/2023   FINDINGS: Segmentation:  Standard.   Alignment:  Mild dextrocurvature.  No significant listhesis.   Vertebrae:  No fracture, evidence of discitis, or bone lesion.   Conus medullaris and cauda equina: Conus extends to the T12-L1 level. Conus and cauda equina appear normal.   Paraspinal and other soft tissues: Negative.   Disc levels:   T12-L1: Broad-based right paracentral disc protrusion with mild impress upon the ventral thecal sac although no significant canal stenosis. No foraminal stenosis.   L1-L2: Unremarkable.   L2-L3: No disc protrusion. Mild bilateral facet arthropathy. No significant foraminal or canal stenosis.   L3-L4: Annular disc bulge and mild bilateral facet arthropathy. No significant canal stenosis. Borderline-mild right foraminal stenosis.   L4-L5: Annular disc bulge with mild bilateral facet arthropathy. No canal stenosis. Borderline-mild bilateral foraminal stenosis.   L5-S1: Mild annular disc bulge and mild bilateral facet hypertrophy. No canal stenosis. Mild-moderate right foraminal stenosis.   IMPRESSION: 1. Mild multilevel lumbar spondylosis. No significant canal stenosis at any level. 2. 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 Borderline-mild foraminal stenosis on the right at L3-L4 and bilaterally at L4-L5.     Electronically Signed   By: Duanne Guess D.O.   On: 05/09/2023 12:57  Imaging: No results found.   PMFS History: Patient Active Problem List   Diagnosis Date Noted   Rectal bleeding 07/21/2014   Past Medical History:  Diagnosis Date   CAD  (coronary artery disease)    CHF (congestive heart failure) (HCC)    Diabetes (HCC)    GERD (gastroesophageal reflux disease)    Hyperlipidemia    Hypertension    Impaired fasting glucose    MI (myocardial infarction) (HCC)    Obesity     History reviewed. No pertinent family history.  Past Surgical History:  Procedure Laterality Date   COLONOSCOPY N/A 07/24/2014   Procedure: COLONOSCOPY;  Surgeon: Malissa Hippo, MD;  Location: [facility];  Service: Endoscopy;  Laterality: N/A;  120   Social History   Occupational History   Not on file  Tobacco Use   Smoking status: Never   Smokeless tobacco: Not on file  Substance and Sexual Activity   Alcohol use: No    Alcohol/week: 0.0 standard drinks of alcohol   Drug use: No   Sexual activity: Not on file

## 2023-05-12 MED ORDER — BUPIVACAINE HCL 0.25 % IJ SOLN
4.0000 mL | INTRAMUSCULAR | Status: AC | PRN
  Administered 2023-05-09: 4 mL via INTRA_ARTICULAR

## 2023-05-12 MED ORDER — METHYLPREDNISOLONE ACETATE 40 MG/ML IJ SUSP
40.0000 mg | INTRAMUSCULAR | Status: AC | PRN
  Administered 2023-05-09: 40 mg via INTRA_ARTICULAR

## 2023-05-12 MED ORDER — LIDOCAINE HCL 1 % IJ SOLN
5.0000 mL | INTRAMUSCULAR | Status: AC | PRN
  Administered 2023-05-09: 5 mL

## 2023-05-23 ENCOUNTER — Other Ambulatory Visit: Payer: Self-pay

## 2023-05-23 ENCOUNTER — Ambulatory Visit: Admitting: Physical Medicine and Rehabilitation

## 2023-05-23 VITALS — BP 163/93 | HR 91

## 2023-05-23 DIAGNOSIS — M5416 Radiculopathy, lumbar region: Secondary | ICD-10-CM | POA: Diagnosis not present

## 2023-05-23 MED ORDER — METHYLPREDNISOLONE ACETATE 40 MG/ML IJ SUSP: 40.0000 mg | Freq: Once | INTRAMUSCULAR | Status: DC

## 2023-05-23 NOTE — Progress Notes (Signed)
 Pain Scale   Average Pain 4        +Driver, -BT, -Dye Allergies.

## 2023-05-23 NOTE — Patient Instructions (Signed)

## 2023-05-24 NOTE — Telephone Encounter (Signed)
VOB submitted for durolane, left knee

## 2023-06-04 NOTE — Procedures (Signed)
 Lumbar Epidural Steroid Injection - Interlaminar Approach with Fluoroscopic Guidance  Patient: Olivia Knight      Date of Birth: [birth year] MRN: [id number] PCP: Assunta Found, MD      Visit Date: 05/23/2023   Universal Protocol:     Consent Given By: the patient  Position: PRONE  Additional Comments: Vital signs were monitored before and after the procedure. Patient was prepped and draped in the usual sterile fashion. The correct patient, procedure, and site was verified.   Injection Procedure Details:   Procedure diagnoses: Radiculopathy, lumbar region [M54.16]   Meds Administered:  Meds ordered this encounter  Medications   methylPREDNISolone acetate (DEPO-MEDROL) injection 40 mg     Laterality: Right  Location/Site:  L5-S1  Needle: 3.5 in., 20 ga. Tuohy  Needle Placement: Paramedian epidural  Findings:   -Comments: Excellent flow of contrast into the epidural space.  Procedure Details: Using a paramedian approach from the side mentioned above, the region overlying the inferior lamina was localized under fluoroscopic visualization and the soft tissues overlying this structure were infiltrated with 4 ml. of 1% Lidocaine without Epinephrine. The Tuohy needle was inserted into the epidural space using a paramedian approach.   The epidural space was localized using loss of resistance along with counter oblique bi-planar fluoroscopic views.  After negative aspirate for air, blood, and CSF, a 2 ml. volume of Isovue-250 was injected into the epidural space and the flow of contrast was observed. Radiographs were obtained for documentation purposes.    The injectate was administered into the level noted above.   Additional Comments:  The patient tolerated the procedure well Dressing: 2 x 2 sterile gauze and Band-Aid    Post-procedure details: Patient was observed during the procedure. Post-procedure instructions were reviewed.  Patient left the clinic in stable  condition.

## 2023-06-04 NOTE — Progress Notes (Signed)
 Olivia Knight - 58 y.o. female MRN [id number]  Date of birth: 02-09-1966  Office Visit Note: Visit Date: 05/23/2023 PCP: Assunta Found, MD Referred by: Assunta Found, MD  Subjective: Chief Complaint  Patient presents with   Lower Back - Pain   HPI:  Olivia Knight is a 58 y.o. female who comes in today at the request of Dr. Burnard Bunting for planned Right L5-S1 Lumbar Interlaminar epidural steroid injection with fluoroscopic guidance.  The patient has failed conservative care including home exercise, medications, time and activity modification.  This injection will be diagnostic and hopefully therapeutic.  Please see requesting physician notes for further details and justification.   ROS Otherwise per HPI.  Assessment & Plan: Visit Diagnoses:    ICD-10-CM   1. Radiculopathy, lumbar region  M54.16 XR C-ARM NO REPORT    Epidural Steroid injection    methylPREDNISolone acetate (DEPO-MEDROL) injection 40 mg      Plan: No additional findings.   Meds & Orders:  Meds ordered this encounter  Medications   methylPREDNISolone acetate (DEPO-MEDROL) injection 40 mg    Orders Placed This Encounter  Procedures   XR C-ARM NO REPORT   Epidural Steroid injection    Follow-up: Return if symptoms worsen or fail to improve.   Procedures: No procedures performed  Lumbar Epidural Steroid Injection - Interlaminar Approach with Fluoroscopic Guidance  Patient: Olivia Knight      Date of Birth: [birth year] MRN: [id number] PCP: Assunta Found, MD      Visit Date: 05/23/2023   Universal Protocol:     Consent Given By: the patient  Position: PRONE  Additional Comments: Vital signs were monitored before and after the procedure. Patient was prepped and draped in the usual sterile fashion. The correct patient, procedure, and site was verified.   Injection Procedure Details:   Procedure diagnoses: Radiculopathy, lumbar region [M54.16]   Meds Administered:  Meds ordered this  encounter  Medications   methylPREDNISolone acetate (DEPO-MEDROL) injection 40 mg     Laterality: Right  Location/Site:  L5-S1  Needle: 3.5 in., 20 ga. Tuohy  Needle Placement: Paramedian epidural  Findings:   -Comments: Excellent flow of contrast into the epidural space.  Procedure Details: Using a paramedian approach from the side mentioned above, the region overlying the inferior lamina was localized under fluoroscopic visualization and the soft tissues overlying this structure were infiltrated with 4 ml. of 1% Lidocaine without Epinephrine. The Tuohy needle was inserted into the epidural space using a paramedian approach.   The epidural space was localized using loss of resistance along with counter oblique bi-planar fluoroscopic views.  After negative aspirate for air, blood, and CSF, a 2 ml. volume of Isovue-250 was injected into the epidural space and the flow of contrast was observed. Radiographs were obtained for documentation purposes.    The injectate was administered into the level noted above.   Additional Comments:  The patient tolerated the procedure well Dressing: 2 x 2 sterile gauze and Band-Aid    Post-procedure details: Patient was observed during the procedure. Post-procedure instructions were reviewed.  Patient left the clinic in stable condition.   Clinical History: Narrative & Impression CLINICAL DATA:  Right-sided radicular pain   EXAM: MRI LUMBAR SPINE WITHOUT CONTRAST   TECHNIQUE: Multiplanar, multisequence MR imaging of the lumbar spine was performed. No intravenous contrast was administered.   COMPARISON:  X-ray 01/10/2023   FINDINGS: Segmentation:  Standard.   Alignment:  Mild dextrocurvature.  No significant listhesis.  Vertebrae:  No fracture, evidence of discitis, or bone lesion.   Conus medullaris and cauda equina: Conus extends to the T12-L1 level. Conus and cauda equina appear normal.   Paraspinal and other soft tissues:  Negative.   Disc levels:   T12-L1: Broad-based right paracentral disc protrusion with mild impress upon the ventral thecal sac although no significant canal stenosis. No foraminal stenosis.   L1-L2: Unremarkable.   L2-L3: No disc protrusion. Mild bilateral facet arthropathy. No significant foraminal or canal stenosis.   L3-L4: Annular disc bulge and mild bilateral facet arthropathy. No significant canal stenosis. Borderline-mild right foraminal stenosis.   L4-L5: Annular disc bulge with mild bilateral facet arthropathy. No canal stenosis. Borderline-mild bilateral foraminal stenosis.   L5-S1: Mild annular disc bulge and mild bilateral facet hypertrophy. No canal stenosis. Mild-moderate right foraminal stenosis.   IMPRESSION: 1. Mild multilevel lumbar spondylosis. No significant canal stenosis at any level. 2. 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 Borderline-mild foraminal stenosis on the right at L3-L4 and bilaterally at L4-L5.     Electronically Signed   By: Duanne Guess D.O.   On: 05/09/2023 12:57     Objective:  VS:  HT:    WT:   BMI:     BP:(!) 163/93  HR:91bpm  TEMP: ( )  RESP:  Physical Exam Vitals and nursing note reviewed.  Constitutional:      General: She is not in acute distress.    Appearance: Normal appearance. She is not ill-appearing.  HENT:     Head: Normocephalic and atraumatic.     Right Ear: External ear normal.     Left Ear: External ear normal.  Eyes:     Extraocular Movements: Extraocular movements intact.  Cardiovascular:     Rate and Rhythm: Normal rate.     Pulses: Normal pulses.  Pulmonary:     Effort: Pulmonary effort is normal. No respiratory distress.  Abdominal:     General: There is no distension.     Palpations: Abdomen is soft.  Musculoskeletal:        General: Tenderness present.     Cervical back: Neck supple.     Right lower leg: No edema.     Left lower leg: No edema.     Comments: Patient has good  distal strength with no pain over the greater trochanters.  No clonus or focal weakness.  Skin:    Findings: No erythema, lesion or rash.  Neurological:     General: No focal deficit present.     Mental Status: She is alert and oriented to person, place, and time.     Sensory: No sensory deficit.     Motor: No weakness or abnormal muscle tone.     Coordination: Coordination normal.  Psychiatric:        Mood and Affect: Mood normal.        Behavior: Behavior normal.      Imaging: No results found.

## 2023-06-07 ENCOUNTER — Encounter: Payer: Self-pay | Admitting: Emergency Medicine

## 2023-06-07 ENCOUNTER — Other Ambulatory Visit: Payer: Self-pay

## 2023-06-07 ENCOUNTER — Ambulatory Visit: Admission: EM | Admit: 2023-06-07 | Discharge: 2023-06-07 | Disposition: A | Discharge status: Home / Self Care

## 2023-06-07 DIAGNOSIS — J101 Influenza due to other identified influenza virus with other respiratory manifestations: Secondary | ICD-10-CM

## 2023-06-07 LAB — POC COVID19/FLU A&B COMBO
Covid Antigen, POC: NEGATIVE
Influenza A Antigen, POC: POSITIVE — AB
Influenza B Antigen, POC: NEGATIVE

## 2023-06-07 MED ORDER — BENZONATATE 100 MG PO CAPS: 100.0000 mg | ORAL_CAPSULE | Freq: Three times a day (TID) | ORAL | 0 refills | Status: AC | PRN

## 2023-06-07 MED ORDER — OSELTAMIVIR PHOSPHATE 75 MG PO CAPS: 75.0000 mg | ORAL_CAPSULE | Freq: Two times a day (BID) | ORAL | 0 refills | Status: AC

## 2023-06-07 MED ORDER — ONDANSETRON 4 MG PO TBDP: 4.0000 mg | ORAL_TABLET | Freq: Three times a day (TID) | ORAL | 0 refills | Status: AC | PRN

## 2023-06-07 NOTE — Discharge Instructions (Signed)
 You have influenza A.  Take the Tamiflu as prescribed to treat it.  Symptoms should improve over the next week to 10 days.  If you develop chest pain or shortness of breath, go to the emergency room.  Some things that can make you feel better are: - Increased rest - Increasing fluid with water/sugar free electrolytes - Acetaminophen 500 mg every 6 hours as needed for fever/pain - Salt water gargling, chloraseptic spray and throat lozenges - OTC guaifenesin (Mucinex) 600 mg twice daily for congestion - Saline sinus flushes or a neti pot - Humidifying the air -Tessalon Perles every 8 hours as needed for dry cough  - Zofran every 8 hours as needed for nausea/vomiting

## 2023-06-07 NOTE — ED Provider Notes (Signed)
 [facility]    CSN: [id number] Arrival date & time: 06/07/23  0827      History   Chief Complaint Chief Complaint  Patient presents with   Cough    HPI Olivia Knight is a 58 y.o. female.   Patient present today with 2-day history of tactile fever, body aches and chills, dry cough, slight runny/stuffy nose, headache, bilateral ear fullness, nausea, diarrhea, decreased appetite, and fatigue.  She denies chest congestion, chest pain, shortness of breath, sore throat, abdominal pain, vomiting.  No drainage from the ears or decreased hearing of the ears.  Has taken over-the-counter Mucinex for symptoms with minimal temporary improvement.    Past Medical History:  Diagnosis Date   CAD (coronary artery disease)    CHF (congestive heart failure) (HCC)    Diabetes (HCC)    GERD (gastroesophageal reflux disease)    Hyperlipidemia    Hypertension    Impaired fasting glucose    MI (myocardial infarction) (HCC)    Obesity     Patient Active Problem List   Diagnosis Date Noted   Rectal bleeding 07/21/2014    Past Surgical History:  Procedure Laterality Date   COLONOSCOPY N/A 07/24/2014   Procedure: COLONOSCOPY;  Surgeon: Malissa Hippo, MD;  [facility];  Service: Endoscopy;  Laterality: N/A;  120    OB History   No obstetric history on file.      Home Medications    Prior to Admission medications   Medication Sig Start Date End Date Taking? Authorizing Provider  benzonatate (TESSALON) 100 MG capsule Take 1 capsule (100 mg total) by mouth 3 (three) times daily as needed for cough. Do not take with alcohol or while operating or driving heavy machinery 4/40/10  Yes Valentino Nose, NP  citalopram (CELEXA) 20 MG tablet 20 mg. 12/05/22  Yes [provider]  ondansetron (ZOFRAN-ODT) 4 MG disintegrating tablet Take 1 tablet (4 mg total) by mouth every 8 (eight) hours as needed for vomiting or nausea. 06/07/23  Yes Valentino Nose, NP   oseltamivir (TAMIFLU) 75 MG capsule Take 1 capsule (75 mg total) by mouth every 12 (twelve) hours for 5 days. 06/07/23 06/12/23 Yes Valentino Nose, NP  Fish Oil-Cholecalciferol (FISH OIL + D3) 1000-1000 MG-UNIT CAPS Take by mouth.    [provider]  levothyroxine (SYNTHROID) 50 MCG tablet Take 1 tablet by mouth daily.    [provider]  norethindrone (MICRONOR,CAMILA,ERRIN) 0.35 MG tablet Take 1 tablet by mouth daily.    [provider]  rosuvastatin (CRESTOR) 10 MG tablet Take 1 tablet by mouth daily.    [provider]    Family History History reviewed. No pertinent family history.  Social History Social History   Tobacco Use   Smoking status: Never  Substance Use Topics   Alcohol use: No    Alcohol/week: 0.0 standard drinks of alcohol   Drug use: No     Allergies   Ciprofloxacin, Fish allergy, Penicillins, and Shellfish allergy   Review of Systems Review of Systems Per HPI  Physical Exam Triage Vital Signs ED Triage Vitals  Encounter Vitals Group     BP 06/07/23 1001 129/80     Systolic BP Percentile --      Diastolic BP Percentile --      Pulse Rate 06/07/23 1001 (!) 103     Resp 06/07/23 1001 20     Temp 06/07/23 1001 99.1 F (37.3 C)  Temp Source 06/07/23 1001 Oral     SpO2 06/07/23 1001 93 %     Weight --      Height --      Head Circumference --      Peak Flow --      Pain Score 06/07/23 0958 3     Pain Loc --      Pain Education --      Exclude from Growth Chart --    No data found.  Updated Vital Signs BP 129/80 (BP Location: Right Arm)   Pulse (!) 103   Temp 99.1 F (37.3 C) (Oral)   Resp 20   SpO2 93%   Visual Acuity Right Eye Distance:   Left Eye Distance:   Bilateral Distance:    Right Eye Near:   Left Eye Near:    Bilateral Near:     Physical Exam Vitals and nursing note reviewed.  Constitutional:      General: She is not in acute distress.    Appearance: Normal appearance. She is  ill-appearing. She is not toxic-appearing.  HENT:     Head: Normocephalic and atraumatic.     Right Ear: Tympanic membrane, ear canal and external ear normal.     Left Ear: Tympanic membrane, ear canal and external ear normal. There is impacted cerumen.     Nose: No congestion or rhinorrhea.     Mouth/Throat:     Mouth: Mucous membranes are moist.     Pharynx: Oropharynx is clear. Posterior oropharyngeal erythema present. No oropharyngeal exudate.  Eyes:     General: No scleral icterus.    Extraocular Movements: Extraocular movements intact.  Cardiovascular:     Rate and Rhythm: Normal rate and regular rhythm.  Pulmonary:     Effort: Pulmonary effort is normal. No respiratory distress.     Breath sounds: Normal breath sounds. No wheezing, rhonchi or rales.  Abdominal:     General: Abdomen is flat. Bowel sounds are normal. There is no distension.     Palpations: Abdomen is soft.     Tenderness: There is no abdominal tenderness. There is no right CVA tenderness, left CVA tenderness or guarding.  Musculoskeletal:     Cervical back: Normal range of motion and neck supple.  Lymphadenopathy:     Cervical: No cervical adenopathy.  Skin:    General: Skin is warm and dry.     Coloration: Skin is not jaundiced or pale.     Findings: No erythema or rash.  Neurological:     Mental Status: She is alert and oriented to person, place, and time.  Psychiatric:        Behavior: Behavior is cooperative.      UC Treatments / Results  Labs (all labs ordered are listed, but only abnormal results are displayed) Labs Reviewed  POC COVID19/FLU A&B COMBO - Abnormal; Notable for the following components:      Result Value   Influenza A Antigen, POC Positive (*)    All other components within normal limits    EKG   Radiology No results found.  Procedures Procedures (including critical care time)  Medications Ordered in UC Medications - No data to display  Initial Impression /  Assessment and Plan / UC Course  I have reviewed the triage vital signs and the nursing notes.  Pertinent labs & imaging results that were available during my care of the patient were reviewed by me and considered in my medical decision making (see chart for  details).   Patient is well-appearing, normotensive, afebrile, tachycardic, not tachypneic, oxygenating well on room air.  She is mildly tachycardic in triage today.  1. Influenza A Vitals and exam are reassuring today; patient is mildly tachycardic, likely due to low-grade fever Will start Tamiflu given age and comorbidities Other supportive care discussed including cough suppressant medication, Zofran as needed for nausea/vomiting Return and ER precautions discussed  The patient was given the opportunity to ask questions.  All questions answered to their satisfaction.  The patient is in agreement to this plan.   Final Clinical Impressions(s) / UC Diagnoses   Final diagnoses:  Influenza A     Discharge Instructions      You have influenza A.  Take the Tamiflu as prescribed to treat it.  Symptoms should improve over the next week to 10 days.  If you develop chest pain or shortness of breath, go to the emergency room.  Some things that can make you feel better are: - Increased rest - Increasing fluid with water/sugar free electrolytes - Acetaminophen 500 mg every 6 hours as needed for fever/pain - Salt water gargling, chloraseptic spray and throat lozenges - OTC guaifenesin (Mucinex) 600 mg twice daily for congestion - Saline sinus flushes or a neti pot - Humidifying the air -Tessalon Perles every 8 hours as needed for dry cough  - Zofran every 8 hours as needed for nausea/vomiting     ED Prescriptions     Medication Sig Dispense Auth. Provider   oseltamivir (TAMIFLU) 75 MG capsule Take 1 capsule (75 mg total) by mouth every 12 (twelve) hours for 5 days. 10 capsule Cathlean Marseilles A, NP   benzonatate (TESSALON) 100 MG  capsule Take 1 capsule (100 mg total) by mouth 3 (three) times daily as needed for cough. Do not take with alcohol or while operating or driving heavy machinery 21 capsule Cathlean Marseilles A, NP   ondansetron (ZOFRAN-ODT) 4 MG disintegrating tablet Take 1 tablet (4 mg total) by mouth every 8 (eight) hours as needed for vomiting or nausea. 20 tablet Valentino Nose, NP      PDMP not reviewed this encounter.   Valentino Nose, NP 06/07/23 1106

## 2023-06-07 NOTE — ED Triage Notes (Signed)
 Pt reports cough, generalized body aches, chills, nausea, fever since Tuesday.

## 2023-08-24 ENCOUNTER — Ambulatory Visit (INDEPENDENT_AMBULATORY_CARE_PROVIDER_SITE_OTHER): Admitting: Orthopedic Surgery

## 2023-08-24 ENCOUNTER — Encounter: Payer: Self-pay | Admitting: Orthopedic Surgery

## 2023-08-24 DIAGNOSIS — M5416 Radiculopathy, lumbar region: Secondary | ICD-10-CM

## 2023-08-24 MED ORDER — TRAMADOL HCL 50 MG PO TABS: 50.0000 mg | ORAL_TABLET | Freq: Two times a day (BID) | ORAL | 0 refills | Status: AC | PRN

## 2023-08-24 NOTE — Progress Notes (Unsigned)
 Office Visit Note   Patient: Olivia Knight           Date of Birth: October 19, 1965           MRN: [id number] Visit Date: 08/24/2023 Requested by: Minus Amel, MD [address] [place],  [place] [address] PCP: Minus Amel, MD  Subjective: Chief Complaint  Patient presents with   Right Leg - Pain   Right Hip - Pain    HPI: Olivia Knight is a 58 y.o. female who presents to the office reporting right hip leg and buttock pain along with low back pain.  Denies any history of injury.  Does report some radiating leg pain.  Prior epidural steroid injection on 05/23/2023 helped for a month.  Reports relatively constant right lower extremity aching which does wake her from sleep at night.  A lot of anterior thigh pain with lateral right calf pain as well.  Describes stiffness in her low back after sitting.  Her epidural steroid injection gave her about 80% relief but that pain relief has reverted back to normal at this time.  No left-sided symptoms.  Sitting to standing is difficult.  Deep pain which is not really localizable to the trochanteric region..                ROS: All systems reviewed are negative as they relate to the chief complaint within the history of present illness.  Patient denies fevers or chills.  Assessment & Plan: Visit Diagnoses:  1. Radiculopathy, lumbar region     Plan: Impression is low back pain with very good relief from epidural steroid injection.  MRI scan from earlier this year does show mild to moderate right foraminal stenosis at L5-S1 which is the predominant right-sided finding.  Plan is refer to Dr. Daisey Dryer for another epidural steroid injection.  Would also like for her to consider some physical therapy just as this seems to be a potential long-term management problem.  She wants to avoid surgery if possible.  Tramadol prescribed.  Follow-up as needed.  Follow-Up Instructions: No follow-ups on file.   Orders:  Orders Placed This Encounter  Procedures    Ambulatory referral to Physical Medicine Rehab   Ambulatory referral to Physical Therapy   No orders of the defined types were placed in this encounter.     Procedures: No procedures performed   Clinical Data: No additional findings.  Objective: Vital Signs: There were no vitals taken for this visit.  Physical Exam:  Constitutional: Patient appears well-developed HEENT:  Head: Normocephalic Eyes:EOM are normal Neck: Normal range of motion Cardiovascular: Normal rate Pulmonary/chest: Effort normal Neurologic: Patient is alert Skin: Skin is warm Psychiatric: Patient has normal mood and affect  Ortho Exam: Ortho exam demonstrates 5 out of 5 ankle dorsiflexion plantarflexion quad hamstring strength with reflexes 0 to 1+ out of 4 bilateral patella and Achilles.  No definite paresthesias L1 S1 bilaterally but she does report paresthesias in the lateral right thigh when it is painful.  No discrete tenderness to the trochanteric bursa palpation on the right or left-hand side.  Mild pain with forward and lateral bending.  No muscle atrophy right leg versus left leg.  Very good hip flexion strength.  Specialty Comments:  Narrative & Impression CLINICAL DATA:  Right-sided radicular pain   EXAM: MRI LUMBAR SPINE WITHOUT CONTRAST   TECHNIQUE: Multiplanar, multisequence MR imaging of the lumbar spine was performed. No intravenous contrast was administered.   COMPARISON:  X-ray 01/10/2023  FINDINGS: Segmentation:  Standard.   Alignment:  Mild dextrocurvature.  No significant listhesis.   Vertebrae:  No fracture, evidence of discitis, or bone lesion.   Conus medullaris and cauda equina: Conus extends to the T12-L1 level. Conus and cauda equina appear normal.   Paraspinal and other soft tissues: Negative.   Disc levels:   T12-L1: Broad-based right paracentral disc protrusion with mild impress upon the ventral thecal sac although no significant canal stenosis. No  foraminal stenosis.   L1-L2: Unremarkable.   L2-L3: No disc protrusion. Mild bilateral facet arthropathy. No significant foraminal or canal stenosis.   L3-L4: Annular disc bulge and mild bilateral facet arthropathy. No significant canal stenosis. Borderline-mild right foraminal stenosis.   L4-L5: Annular disc bulge with mild bilateral facet arthropathy. No canal stenosis. Borderline-mild bilateral foraminal stenosis.   L5-S1: Mild annular disc bulge and mild bilateral facet hypertrophy. No canal stenosis. Mild-moderate right foraminal stenosis.   IMPRESSION: 1. Mild multilevel lumbar spondylosis. No significant canal stenosis at any level. 2. 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 Borderline-mild foraminal stenosis on the right at L3-L4 and bilaterally at L4-L5.     Electronically Signed   By: Leverne Reading D.O.   On: 05/09/2023 12:57  Imaging: No results found.   PMFS History: Patient Active Problem List   Diagnosis Date Noted   Rectal bleeding 07/21/2014   Past Medical History:  Diagnosis Date   CAD (coronary artery disease)    CHF (congestive heart failure) (HCC)    Diabetes (HCC)    GERD (gastroesophageal reflux disease)    Hyperlipidemia    Hypertension    Impaired fasting glucose    MI (myocardial infarction) (HCC)    Obesity     History reviewed. No pertinent family history.  Past Surgical History:  Procedure Laterality Date   COLONOSCOPY N/A 07/24/2014   Procedure: COLONOSCOPY;  Surgeon: Ruby Corporal, MD;  Location: [facility];  Service: Endoscopy;  Laterality: N/A;  120   Social History   Occupational History   Not on file  Tobacco Use   Smoking status: Never   Smokeless tobacco: Not on file  Substance and Sexual Activity   Alcohol use: No    Alcohol/week: 0.0 standard drinks of alcohol   Drug use: No   Sexual activity: Not on file

## 2023-09-26 ENCOUNTER — Ambulatory Visit (HOSPITAL_COMMUNITY)

## 2023-09-26 ENCOUNTER — Other Ambulatory Visit: Payer: Self-pay

## 2023-09-26 ENCOUNTER — Encounter (HOSPITAL_COMMUNITY): Payer: Self-pay

## 2023-09-26 NOTE — Therapy (Deleted)
 [facility] at [facility] [address] [place], [place], [address] Phone: [phone]   Fax:  [phone]  Patient Details  Name: Olivia Knight MRN: [id number] Date of Birth: [birth year] Referring Provider:  Marvine Rush, MD  Encounter Date: 09/26/2023  Pt declines PT eval due to out of pocket cost on todays date.  Lang Ada, PT, DPT [facility] Office: [phone] 5:08 PM, 09/26/23   [facility] at [facility] [address] [place], [place], [address] Phone: [phone]   Fax:  [phone]

## 2023-10-01 ENCOUNTER — Ambulatory Visit: Admitting: Physical Medicine and Rehabilitation

## 2023-10-01 ENCOUNTER — Other Ambulatory Visit: Payer: Self-pay

## 2023-10-01 VITALS — BP 159/92 | HR 68

## 2023-10-01 DIAGNOSIS — M5416 Radiculopathy, lumbar region: Secondary | ICD-10-CM | POA: Diagnosis not present

## 2023-10-01 MED ORDER — METHYLPREDNISOLONE ACETATE 40 MG/ML IJ SUSP
40.0000 mg | Freq: Once | INTRAMUSCULAR | Status: AC
  Administered 2023-10-01: 40 mg

## 2023-10-01 NOTE — Patient Instructions (Signed)

## 2023-10-01 NOTE — Progress Notes (Signed)
 Pain Scale   Average Pain 3 Patient advising she has chronic lower back radiating to right thigh area. Patient states she has an increase of pain when standing and pain decreases when she rests.         +Driver, -BT, -Dye Allergies.

## 2023-10-07 NOTE — Progress Notes (Signed)
 PRUDY CANDY - 58 y.o. female MRN [id number]  Date of birth: 30-Jun-1965  Office Visit Note: Visit Date: 10/01/2023 PCP: Marvine Rush, MD Referred by: Marvine Rush, MD  Subjective: Chief Complaint  Patient presents with   Lower Back - Pain   HPI:  Olivia Knight is a 58 y.o. female who comes in today at the request of Dr. JUDITHANN Glendia Hutchinson for planned Right L5-S1 Lumbar Interlaminar epidural steroid injection with fluoroscopic guidance.  The patient has failed conservative care including home exercise, medications, time and activity modification.  This injection will be diagnostic and hopefully therapeutic.  Please see requesting physician notes for further details and justification.   ROS Otherwise per HPI.  Assessment & Plan: Visit Diagnoses:    ICD-10-CM   1. Radiculopathy, lumbar region  M54.16 XR C-ARM NO REPORT    Epidural Steroid injection    methylPREDNISolone  acetate (DEPO-MEDROL ) injection 40 mg      Plan: No additional findings.   Meds & Orders:  Meds ordered this encounter  Medications   methylPREDNISolone  acetate (DEPO-MEDROL ) injection 40 mg    Orders Placed This Encounter  Procedures   XR C-ARM NO REPORT   Epidural Steroid injection    Follow-up: Return for visit to requesting provider as needed.   Procedures: No procedures performed  Lumbar Epidural Steroid Injection - Interlaminar Approach with Fluoroscopic Guidance  Patient: Olivia Knight      Date of Birth: May 04, 1965 MRN: [id number] PCP: Marvine Rush, MD      Visit Date: 10/01/2023   Universal Protocol:     Consent Given By: the patient  Position: PRONE  Additional Comments: Vital signs were monitored before and after the procedure. Patient was prepped and draped in the usual sterile fashion. The correct patient, procedure, and site was verified.   Injection Procedure Details:   Procedure diagnoses: Radiculopathy, lumbar region [M54.16]   Meds Administered:  Meds ordered  this encounter  Medications   methylPREDNISolone  acetate (DEPO-MEDROL ) injection 40 mg     Laterality: Right  Location/Site:  L5-S1  Needle: 3.5 in., 20 ga. Tuohy  Needle Placement: Paramedian epidural  Findings:   -Comments: Excellent flow of contrast into the epidural space.  Procedure Details: Using a paramedian approach from the side mentioned above, the region overlying the inferior lamina was localized under fluoroscopic visualization and the soft tissues overlying this structure were infiltrated with 4 ml. of 1% Lidocaine  without Epinephrine. The Tuohy needle was inserted into the epidural space using a paramedian approach.   The epidural space was localized using loss of resistance along with counter oblique bi-planar fluoroscopic views.  After negative aspirate for air, blood, and CSF, a 2 ml. volume of Isovue-250 was injected into the epidural space and the flow of contrast was observed. Radiographs were obtained for documentation purposes.    The injectate was administered into the level noted above.   Additional Comments:  The patient tolerated the procedure well Dressing: 2 x 2 sterile gauze and Band-Aid    Post-procedure details: Patient was observed during the procedure. Post-procedure instructions were reviewed.  Patient left the clinic in stable condition.   Clinical History: Narrative & Impression CLINICAL DATA:  Right-sided radicular pain   EXAM: MRI LUMBAR SPINE WITHOUT CONTRAST   TECHNIQUE: Multiplanar, multisequence MR imaging of the lumbar spine was performed. No intravenous contrast was administered.   COMPARISON:  X-ray 01/10/2023   FINDINGS: Segmentation:  Standard.   Alignment:  Mild dextrocurvature.  No significant listhesis.  Vertebrae:  No fracture, evidence of discitis, or bone lesion.   Conus medullaris and cauda equina: Conus extends to the T12-L1 level. Conus and cauda equina appear normal.   Paraspinal and other soft  tissues: Negative.   Disc levels:   T12-L1: Broad-based right paracentral disc protrusion with mild impress upon the ventral thecal sac although no significant canal stenosis. No foraminal stenosis.   L1-L2: Unremarkable.   L2-L3: No disc protrusion. Mild bilateral facet arthropathy. No significant foraminal or canal stenosis.   L3-L4: Annular disc bulge and mild bilateral facet arthropathy. No significant canal stenosis. Borderline-mild right foraminal stenosis.   L4-L5: Annular disc bulge with mild bilateral facet arthropathy. No canal stenosis. Borderline-mild bilateral foraminal stenosis.   L5-S1: Mild annular disc bulge and mild bilateral facet hypertrophy. No canal stenosis. Mild-moderate right foraminal stenosis.   IMPRESSION: 1. Mild multilevel lumbar spondylosis. No significant canal stenosis at any level. 2. 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 Borderline-mild foraminal stenosis on the right at L3-L4 and bilaterally at L4-L5.     Electronically Signed   By: Mabel Converse D.O.   On: 05/09/2023 12:57     Objective:  VS:  HT:    WT:   BMI:     BP:(!) 159/92  HR:68bpm  TEMP: ( )  RESP:  Physical Exam Vitals and nursing note reviewed.  Constitutional:      General: She is not in acute distress.    Appearance: Normal appearance. She is not ill-appearing.  HENT:     Head: Normocephalic and atraumatic.     Right Ear: External ear normal.     Left Ear: External ear normal.  Eyes:     Extraocular Movements: Extraocular movements intact.  Cardiovascular:     Rate and Rhythm: Normal rate.     Pulses: Normal pulses.  Pulmonary:     Effort: Pulmonary effort is normal. No respiratory distress.  Abdominal:     General: There is no distension.     Palpations: Abdomen is soft.  Musculoskeletal:        General: Tenderness present.     Cervical back: Neck supple.     Right lower leg: No edema.     Left lower leg: No edema.     Comments: Patient  has good distal strength with no pain over the greater trochanters.  No clonus or focal weakness.  Skin:    Findings: No erythema, lesion or rash.  Neurological:     General: No focal deficit present.     Mental Status: She is alert and oriented to person, place, and time.     Sensory: No sensory deficit.     Motor: No weakness or abnormal muscle tone.     Coordination: Coordination normal.  Psychiatric:        Mood and Affect: Mood normal.        Behavior: Behavior normal.      Imaging: No results found.

## 2023-10-07 NOTE — Procedures (Signed)
 Lumbar Epidural Steroid Injection - Interlaminar Approach with Fluoroscopic Guidance  Patient: Olivia Knight      Date of Birth: 08-Jan-1966 MRN: [id number] PCP: Marvine Rush, MD      Visit Date: 10/01/2023   Universal Protocol:     Consent Given By: the patient  Position: PRONE  Additional Comments: Vital signs were monitored before and after the procedure. Patient was prepped and draped in the usual sterile fashion. The correct patient, procedure, and site was verified.   Injection Procedure Details:   Procedure diagnoses: Radiculopathy, lumbar region [M54.16]   Meds Administered:  Meds ordered this encounter  Medications   methylPREDNISolone  acetate (DEPO-MEDROL ) injection 40 mg     Laterality: Right  Location/Site:  L5-S1  Needle: 3.5 in., 20 ga. Tuohy  Needle Placement: Paramedian epidural  Findings:   -Comments: Excellent flow of contrast into the epidural space.  Procedure Details: Using a paramedian approach from the side mentioned above, the region overlying the inferior lamina was localized under fluoroscopic visualization and the soft tissues overlying this structure were infiltrated with 4 ml. of 1% Lidocaine  without Epinephrine. The Tuohy needle was inserted into the epidural space using a paramedian approach.   The epidural space was localized using loss of resistance along with counter oblique bi-planar fluoroscopic views.  After negative aspirate for air, blood, and CSF, a 2 ml. volume of Isovue-250 was injected into the epidural space and the flow of contrast was observed. Radiographs were obtained for documentation purposes.    The injectate was administered into the level noted above.   Additional Comments:  The patient tolerated the procedure well Dressing: 2 x 2 sterile gauze and Band-Aid    Post-procedure details: Patient was observed during the procedure. Post-procedure instructions were reviewed.  Patient left the clinic in stable  condition.

## 2024-01-14 ENCOUNTER — Encounter: Payer: Self-pay | Admitting: Radiology

## 2024-04-04 ENCOUNTER — Ambulatory Visit: Admitting: Orthopedic Surgery

## 2024-04-04 DIAGNOSIS — M1712 Unilateral primary osteoarthritis, left knee: Secondary | ICD-10-CM | POA: Diagnosis not present

## 2024-04-05 ENCOUNTER — Encounter: Payer: Self-pay | Admitting: Orthopedic Surgery

## 2024-04-05 MED ORDER — TRIAMCINOLONE ACETONIDE 40 MG/ML IJ SUSP
40.0000 mg | INTRAMUSCULAR | Status: AC | PRN
  Administered 2024-04-04: 40 mg via INTRA_ARTICULAR

## 2024-04-05 MED ORDER — BUPIVACAINE HCL 0.25 % IJ SOLN
4.0000 mL | INTRAMUSCULAR | Status: AC | PRN
  Administered 2024-04-04: 4 mL via INTRA_ARTICULAR

## 2024-04-05 MED ORDER — LIDOCAINE HCL 1 % IJ SOLN
5.0000 mL | INTRAMUSCULAR | Status: AC | PRN
  Administered 2024-04-04: 5 mL

## 2024-04-05 NOTE — Progress Notes (Signed)
 "  Office Visit Note   Patient: Olivia Knight           Date of Birth: 28-Jan-1966           MRN: [id number] Visit Date: 04/04/2024 Requested by: Marvine Rush, MD [address] [place],  [place] [address] PCP: Marvine Rush, MD  Subjective: Chief Complaint  Patient presents with   Left Knee - Pain    Cortisone injection planned today    HPI: Olivia Knight is a 59 y.o. female who presents to the office reporting left knee pain.  It has been about a year since her last knee injection.  Overall she has been doing activity modification as well as quad strengthening exercises.  Denies any interval history of injury or trauma.  Takes over-the-counter medication occasionally..                ROS: All systems reviewed are negative as they relate to the chief complaint within the history of present illness.  Patient denies fevers or chills.  Assessment & Plan: Visit Diagnoses: No diagnosis found.  Plan: Impression is left knee arthritis.  Good result with prior cortisone injection.  Repeat cortisone injection performed today.  Continue with nonweightbearing quad strengthening exercises and follow-up as needed.  Follow-Up Instructions: No follow-ups on file.   Orders:  No orders of the defined types were placed in this encounter.  No orders of the defined types were placed in this encounter.     Procedures: Large Joint Inj on 04/04/2024 10:45 AM Indications: diagnostic evaluation, joint swelling and pain Details: 18 G 1.5 in needle, superolateral approach  Arthrogram: No  Medications: 5 mL lidocaine  1 %; 4 mL bupivacaine  0.25 %; 40 mg triamcinolone  acetonide 40 MG/ML Outcome: tolerated well, no immediate complications Procedure, treatment alternatives, risks and benefits explained, specific risks discussed. Consent was given by the patient. Immediately prior to procedure a time out was called to verify the correct patient, procedure, equipment, support staff and site/side marked as  required. Patient was prepped and draped in the usual sterile fashion.       Clinical Data: No additional findings.  Objective: Vital Signs: There were no vitals taken for this visit.  Physical Exam:  Constitutional: Patient appears well-developed HEENT:  Head: Normocephalic Eyes:EOM are normal Neck: Normal range of motion Cardiovascular: Normal rate Pulmonary/chest: Effort normal Neurologic: Patient is alert Skin: Skin is warm Psychiatric: Patient has normal mood and affect  Ortho Exam: Ortho exam demonstrates range of motion of about 3-1 20 of flexion.  Trace effusion is present.  Collateral crucial ligaments are stable.  No masses, no skin changes noted in that left knee region.  Extensor mechanism intact and nontender.  Specialty Comments:  Narrative & Impression CLINICAL DATA:  Right-sided radicular pain   EXAM: MRI LUMBAR SPINE WITHOUT CONTRAST   TECHNIQUE: Multiplanar, multisequence MR imaging of the lumbar spine was performed. No intravenous contrast was administered.   COMPARISON:  X-ray 01/10/2023   FINDINGS: Segmentation:  Standard.   Alignment:  Mild dextrocurvature.  No significant listhesis.   Vertebrae:  No fracture, evidence of discitis, or bone lesion.   Conus medullaris and cauda equina: Conus extends to the T12-L1 level. Conus and cauda equina appear normal.   Paraspinal and other soft tissues: Negative.   Disc levels:   T12-L1: Broad-based right paracentral disc protrusion with mild impress upon the ventral thecal sac although no significant canal stenosis. No foraminal stenosis.   L1-L2: Unremarkable.  L2-L3: No disc protrusion. Mild bilateral facet arthropathy. No significant foraminal or canal stenosis.   L3-L4: Annular disc bulge and mild bilateral facet arthropathy. No significant canal stenosis. Borderline-mild right foraminal stenosis.   L4-L5: Annular disc bulge with mild bilateral facet arthropathy. No canal stenosis.  Borderline-mild bilateral foraminal stenosis.   L5-S1: Mild annular disc bulge and mild bilateral facet hypertrophy. No canal stenosis. Mild-moderate right foraminal stenosis.   IMPRESSION: 1. Mild multilevel lumbar spondylosis. No significant canal stenosis at any level. 2. 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 Borderline-mild foraminal stenosis on the right at L3-L4 and bilaterally at L4-L5.     Electronically Signed   By: Mabel Converse D.O.   On: 05/09/2023 12:57  Imaging: No results found.   PMFS History: Patient Active Problem List   Diagnosis Date Noted   Rectal bleeding 07/21/2014   Past Medical History:  Diagnosis Date   CAD (coronary artery disease)    CHF (congestive heart failure) (HCC)    Diabetes (HCC)    GERD (gastroesophageal reflux disease)    Hyperlipidemia    Hypertension    Impaired fasting glucose    MI (myocardial infarction) (HCC)    Obesity     History reviewed. No pertinent family history.  Past Surgical History:  Procedure Laterality Date   COLONOSCOPY N/A 07/24/2014   Procedure: COLONOSCOPY;  Surgeon: Claudis RAYMOND Rivet, MD;  Location: [facility];  Service: Endoscopy;  Laterality: N/A;  120   Social History   Occupational History   Not on file  Tobacco Use   Smoking status: Never   Smokeless tobacco: Not on file  Substance and Sexual Activity   Alcohol use: No    Alcohol/week: 0.0 standard drinks of alcohol   Drug use: No   Sexual activity: Not on file        "

## 2024-04-10 ENCOUNTER — Other Ambulatory Visit (HOSPITAL_COMMUNITY): Payer: Self-pay | Admitting: Family Medicine

## 2024-04-10 ENCOUNTER — Ambulatory Visit: Admitting: Orthopedic Surgery

## 2024-04-10 DIAGNOSIS — E785 Hyperlipidemia, unspecified: Secondary | ICD-10-CM

## 2024-05-15 ENCOUNTER — Other Ambulatory Visit (HOSPITAL_COMMUNITY)

## 2024-05-30 ENCOUNTER — Ambulatory Visit (HOSPITAL_COMMUNITY)
